# Patient Record
Sex: Female | Born: 1990 | Race: White | Hispanic: No | Marital: Single | Smoking: Never smoker
Health system: Southern US, Community
[De-identification: ages and names within clinical notes are randomized; demographics above are authoritative.]

## PROBLEM LIST (undated history)

## (undated) DIAGNOSIS — F329 Major depressive disorder, single episode, unspecified: Secondary | ICD-10-CM

## (undated) DIAGNOSIS — G4700 Insomnia, unspecified: Secondary | ICD-10-CM

## (undated) DIAGNOSIS — F32A Depression, unspecified: Secondary | ICD-10-CM

## (undated) DIAGNOSIS — F909 Attention-deficit hyperactivity disorder, unspecified type: Secondary | ICD-10-CM

## (undated) DIAGNOSIS — R011 Cardiac murmur, unspecified: Secondary | ICD-10-CM

## (undated) DIAGNOSIS — E282 Polycystic ovarian syndrome: Secondary | ICD-10-CM

## (undated) DIAGNOSIS — E669 Obesity, unspecified: Secondary | ICD-10-CM

## (undated) DIAGNOSIS — F319 Bipolar disorder, unspecified: Secondary | ICD-10-CM

## (undated) HISTORY — PX: DILATION AND CURETTAGE OF UTERUS: SHX78

## (undated) HISTORY — PX: FRACTURE SURGERY: SHX138

---

## 2011-05-10 ENCOUNTER — Emergency Department (HOSPITAL_COMMUNITY)
Admission: EM | Admit: 2011-05-10 | Discharge: 2011-05-10 | Disposition: A | Discharge status: Other Acute Inpatient Hospital | Payer: Self-pay | Source: Home / Self Care | Attending: Emergency Medicine | Admitting: Emergency Medicine

## 2011-05-10 ENCOUNTER — Inpatient Hospital Stay (HOSPITAL_COMMUNITY)
Admission: EM | Admit: 2011-05-10 | Discharge: 2011-05-11 | DRG: 918 | Disposition: A | Discharge status: Other Acute Inpatient Hospital | Payer: Self-pay | Source: Ambulatory Visit | Attending: Internal Medicine | Admitting: Internal Medicine

## 2011-05-10 DIAGNOSIS — E876 Hypokalemia: Secondary | ICD-10-CM | POA: Diagnosis present

## 2011-05-10 DIAGNOSIS — T398X2A Poisoning by other nonopioid analgesics and antipyretics, not elsewhere classified, intentional self-harm, initial encounter: Secondary | ICD-10-CM | POA: Diagnosis present

## 2011-05-10 DIAGNOSIS — D649 Anemia, unspecified: Secondary | ICD-10-CM | POA: Diagnosis present

## 2011-05-10 DIAGNOSIS — F319 Bipolar disorder, unspecified: Secondary | ICD-10-CM | POA: Diagnosis present

## 2011-05-10 DIAGNOSIS — T394X2A Poisoning by antirheumatics, not elsewhere classified, intentional self-harm, initial encounter: Secondary | ICD-10-CM | POA: Diagnosis present

## 2011-05-10 DIAGNOSIS — T39094A Poisoning by salicylates, undetermined, initial encounter: Principal | ICD-10-CM | POA: Diagnosis present

## 2011-05-10 DIAGNOSIS — R112 Nausea with vomiting, unspecified: Secondary | ICD-10-CM | POA: Diagnosis present

## 2011-05-10 LAB — BLOOD GAS, ARTERIAL
Acid-base deficit: 7.7 mmol/L — ABNORMAL HIGH (ref 0.0–2.0)
Bicarbonate: 13.6 mEq/L — ABNORMAL LOW (ref 20.0–24.0)
O2 Saturation: 98.9 %
TCO2: 12 mmol/L (ref 0–100)
pO2, Arterial: 120 mmHg — ABNORMAL HIGH (ref 80.0–100.0)

## 2011-05-10 LAB — CBC
HCT: 39.1 % (ref 36.0–46.0)
HCT: 36.5 % (ref 36.0–46.0)
Hemoglobin: 13.2 g/dL (ref 12.0–15.0)
MCH: 27.5 pg (ref 26.0–34.0)
MCHC: 33.8 g/dL (ref 30.0–36.0)
MCHC: 34.2 g/dL (ref 30.0–36.0)
MCV: 80.2 fL (ref 78.0–100.0)
Platelets: 261 10*3/uL (ref 150–400)
RBC: 4.79 MIL/uL (ref 3.87–5.11)
RDW: 13.3 % (ref 11.5–15.5)
WBC: 11.5 10*3/uL — ABNORMAL HIGH (ref 4.0–10.5)

## 2011-05-10 LAB — COMPREHENSIVE METABOLIC PANEL
ALT: 16 U/L (ref 0–35)
ALT: 16 U/L (ref 0–35)
AST: 20 U/L (ref 0–37)
AST: 18 U/L (ref 0–37)
Alkaline Phosphatase: 70 U/L (ref 39–117)
CO2: 15 mEq/L — ABNORMAL LOW (ref 19–32)
CO2: 17 mEq/L — ABNORMAL LOW (ref 19–32)
Calcium: 8.2 mg/dL — ABNORMAL LOW (ref 8.4–10.5)
Calcium: 8.6 mg/dL (ref 8.4–10.5)
Chloride: 108 mEq/L (ref 96–112)
Chloride: 106 mEq/L (ref 96–112)
GFR calc non Af Amer: >60 mL/min (ref >60)
Glucose, Bld: 121 mg/dL — ABNORMAL HIGH (ref 70–99)
Glucose, Bld: 166 mg/dL — ABNORMAL HIGH (ref 70–99)
Potassium: 3 mEq/L — ABNORMAL LOW (ref 3.5–5.1)
Sodium: 140 mEq/L (ref 135–145)
Total Bilirubin: 0.1 mg/dL — ABNORMAL LOW (ref 0.3–1.2)
Total Protein: 7.3 g/dL (ref 6.0–8.3)

## 2011-05-10 LAB — URINALYSIS, ROUTINE W REFLEX MICROSCOPIC
Glucose, UA: NEGATIVE mg/dL
Hgb urine dipstick: NEGATIVE
Specific Gravity, Urine: 1.029 (ref 1.005–1.030)
pH: 6.5 (ref 5.0–8.0)

## 2011-05-10 LAB — GLUCOSE, CAPILLARY: Glucose-Capillary: 154 mg/dL — ABNORMAL HIGH (ref 70–99)

## 2011-05-10 LAB — BASIC METABOLIC PANEL
BUN: 5 mg/dL — ABNORMAL LOW (ref 6–23)
Calcium: 8.7 mg/dL (ref 8.4–10.5)
Calcium: 8.2 mg/dL — ABNORMAL LOW (ref 8.4–10.5)
Creatinine, Ser: 0.66 mg/dL (ref 0.4–1.2)
GFR calc non Af Amer: >60 mL/min (ref >60)
GFR calc non Af Amer: >60 mL/min (ref >60)
Glucose, Bld: 120 mg/dL — ABNORMAL HIGH (ref 70–99)
Glucose, Bld: 86 mg/dL (ref 70–99)
Sodium: 139 mEq/L (ref 135–145)
Sodium: 139 mEq/L (ref 135–145)

## 2011-05-10 LAB — RAPID URINE DRUG SCREEN, HOSP PERFORMED
Barbiturates: NOT DETECTED
Opiates: NOT DETECTED
Tetrahydrocannabinol: NOT DETECTED

## 2011-05-10 LAB — DIFFERENTIAL
Eosinophils Relative: 0 % (ref 0–5)
Lymphocytes Relative: 10 % — ABNORMAL LOW (ref 12–46)
Lymphs Abs: 1 10*3/uL (ref 0.7–4.0)
Monocytes Relative: 5 % (ref 3–12)
Neutro Abs: 8 10*3/uL — ABNORMAL HIGH (ref 1.7–7.7)

## 2011-05-10 LAB — URINE MICROSCOPIC-ADD ON

## 2011-05-10 LAB — MAGNESIUM: Magnesium: 2 mg/dL (ref 1.5–2.5)

## 2011-05-10 LAB — ETHANOL: Alcohol, Ethyl (B): <11 mg/dL — ABNORMAL HIGH (ref 0–10)

## 2011-05-10 LAB — MRSA PCR SCREENING: MRSA by PCR: NEGATIVE

## 2011-05-10 LAB — PREGNANCY, URINE: Preg Test, Ur: NEGATIVE

## 2011-05-11 ENCOUNTER — Inpatient Hospital Stay (HOSPITAL_COMMUNITY)
Admission: AD | Admit: 2011-05-11 | Discharge: 2011-05-18 | DRG: 882 | Disposition: A | Discharge status: Home / Self Care | Payer: PRIVATE HEALTH INSURANCE | Source: Ambulatory Visit | Attending: Psychiatry | Admitting: Psychiatry

## 2011-05-11 DIAGNOSIS — F4310 Post-traumatic stress disorder, unspecified: Secondary | ICD-10-CM

## 2011-05-11 DIAGNOSIS — T394X2A Poisoning by antirheumatics, not elsewhere classified, intentional self-harm, initial encounter: Secondary | ICD-10-CM

## 2011-05-11 DIAGNOSIS — F3112 Bipolar disorder, current episode manic without psychotic features, moderate: Secondary | ICD-10-CM

## 2011-05-11 DIAGNOSIS — Z5900 Homelessness unspecified: Secondary | ICD-10-CM

## 2011-05-11 DIAGNOSIS — F4325 Adjustment disorder with mixed disturbance of emotions and conduct: Principal | ICD-10-CM

## 2011-05-11 DIAGNOSIS — E282 Polycystic ovarian syndrome: Secondary | ICD-10-CM

## 2011-05-11 DIAGNOSIS — F339 Major depressive disorder, recurrent, unspecified: Secondary | ICD-10-CM

## 2011-05-11 DIAGNOSIS — R51 Headache: Secondary | ICD-10-CM

## 2011-05-11 DIAGNOSIS — T391X1A Poisoning by 4-Aminophenol derivatives, accidental (unintentional), initial encounter: Secondary | ICD-10-CM

## 2011-05-11 DIAGNOSIS — F39 Unspecified mood [affective] disorder: Secondary | ICD-10-CM

## 2011-05-11 LAB — COMPREHENSIVE METABOLIC PANEL
ALT: 13 U/L (ref 0–35)
AST: 13 U/L (ref 0–37)
CO2: 21 mEq/L (ref 19–32)
Chloride: 111 mEq/L (ref 96–112)
Creatinine, Ser: 0.74 mg/dL (ref 0.4–1.2)
GFR calc Af Amer: >60 mL/min (ref >60)
GFR calc non Af Amer: >60 mL/min (ref >60)
Glucose, Bld: 107 mg/dL — ABNORMAL HIGH (ref 70–99)
Total Bilirubin: 0.2 mg/dL — ABNORMAL LOW (ref 0.3–1.2)

## 2011-05-11 LAB — CBC
Hemoglobin: 10.9 g/dL — ABNORMAL LOW (ref 12.0–15.0)
MCH: 28.4 pg (ref 26.0–34.0)
MCHC: 34.4 g/dL (ref 30.0–36.0)
RDW: 13.9 % (ref 11.5–15.5)

## 2011-05-11 LAB — MAGNESIUM: Magnesium: 2.1 mg/dL (ref 1.5–2.5)

## 2011-05-11 LAB — PROTIME-INR
INR: 1.29 (ref 0.00–1.49)
Prothrombin Time: 16.3 seconds — ABNORMAL HIGH (ref 11.6–15.2)
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
## 2011-05-11 NOTE — Discharge Summary (Signed)
[name], Hannah [name]                 ACCOUNT NO.:  [id number]  MEDICAL RECORD NO.:  [id number]           PATIENT TYPE:  I  LOCATION:  3302                         FACILITY:  [facility]  PHYSICIAN:  Hillery Aldo, M.D.   DATE OF BIRTH:  04-Aug-1991  DATE OF ADMISSION:  05/10/2011 DATE OF DISCHARGE:  05/11/2011                              DISCHARGE SUMMARY   PRIMARY CARE PHYSICIAN:  None.  DISCHARGE DIAGNOSES: 1. Aspirin/Tylenol overdose. 2. Suicide attempt. 3. Hypokalemia. 4. Normocytic anemia. 5. Questionable history of bipolar disorder.  DISCHARGE MEDICATIONS:  None.  CONSULTATIONS:  Eulogio Ditch, MD, Psychiatry.  BRIEF ADMISSION HISTORY OF PRESENT ILLNESS:  The patient is a 20 year old female with a questionable history of bipolar disorder who presented to the hospital with chief complaint of nausea and vomiting after taking approximately 90 Excedrin Extra Strength tablets in an attempt to end her life over a breakup with her boyfriend.  Upon initial evaluation in the emergency department, the patient was complaining of tinnitus but was otherwise stable.  For full details, please see the dictated report done by Dr. Adela Glimpse.  PROCEDURES AND DIAGNOSTIC STUDIES:  None.  DISCHARGE LABORATORY VALUES:  Salicylate level was 4.7.  Magnesium was 2.1.  Sodium was 140, potassium 3.8, chloride 111, bicarb 21, BUN 12, creatinine 0.74, glucose 107, total bilirubin 0.2, alkaline phosphatase 60, AST 13, ALT 13, total protein 5.8, albumin 2.9, calcium 8.5. Acetaminophen level was less than 15.  PTT was 32, PT 16.3.  White blood cell count was 6.5, hemoglobin 10.9, hematocrit 31.7, platelets 241,000. Arterial blood gas showed a pH of 7.434, pCO2 31, pO2 106.  HOSPITAL COURSE BY PROBLEM: 1. Aspirin/acetaminophen overdose:  The patient was monitored closely     in the Step-down Unit.  Her urine was alkalinized and serial     laboratories were checked.  The patient did not develop any  signs     or symptoms of liver or other toxicities.  At this point, she can     safely discontinue IV fluids and be transferred to the Psychiatric     Unit for further care of her ongoing psychiatric issues. 2. Suicide attempt:  The patient was maintained with a Recruitment consultant     and has now been evaluated by Psychiatry and accepted to the     Inpatient Psychiatric Unit for further care. 3. Hypokalemia:  The patient's potassium was fully repleted. 4. Normocytic anemia:  Felt to be secondary to menstrual losses in     this 20 year old female.  No further workup was undertaken. 5. Questionable history of bipolar disorder:  The patient will need to     be stabilized on psychiatric medications and this can be     accomplished at the Inpatient Psychiatric Facility.  DISPOSITION:  The patient is medically stable and will be discharged to Inpatient Psychiatry for further care of her underlying psychiatric issues.  CONDITION ON DISCHARGE:  Stable.  Time spent coordinating care for discharge and discharge instructions including face-to-face time is approximately 20 minutes.     Hillery Aldo, M.D.     CR/MEDQ  D:  05/11/2011  T:  05/11/2011  Job:  [id number]  Electronically Signed by Hillery Aldo M.D. on 05/11/2011 01:14:50 PM

## 2011-05-12 LAB — BLOOD GAS, ARTERIAL
Acid-base deficit: 3.2 mmol/L — ABNORMAL HIGH (ref 0.0–2.0)
Drawn by: 31306
O2 Saturation: 98.8 %
Patient temperature: 98.6
pCO2 arterial: 31 mmHg — ABNORMAL LOW (ref 35.0–45.0)

## 2011-05-12 NOTE — Consult Note (Signed)
  NAME:  Hannah [name], Hannah [name]                 ACCOUNT NO.:  [id number]  MEDICAL RECORD NO.:  [id number]           PATIENT TYPE:  I  LOCATION:  500                           [facility]ILITY[facility]  [facility]  PHYSICIAN:  Eulogio Ditch, MD DATE OF BIRTH:  Feb 08, 1991  DATE OF CONSULTATION: DATE OF DISCHARGE:                                CONSULTATION   REASON FOR CONSULTATION:  Overdose.  HISTORY OF PRESENT ILLNESS:  A 20 year old female who overdosed on 90 pills of Excedrin extra strength as she was feeling depressed after separation from boyfriend.  The patient reported she has a history of bipolar disorder and she remained most of the time in the depressive phase.  The patient was on Depakote and Abilify in the past for few years, but currently she is noncompliant with her medication as she told she cannot afford these medications.  MEDICAL HISTORY:  Significant for ovarian cyst.  SUBSTANCE ABUSE HISTORY:  The patient denies abusing drugs or alcohol.  SOCIAL HISTORY:  The patient currently is homeless.  She has been all the time in the foster care.  PAST PSYCH HISTORY:  The patient has been admitted in [place], the last time 5 to 6 years ago.  The patient has a history of bipolar disorder.  ALLERGIES:  The patient is allergic to DEMEROL.  MENTAL STATUS EXAM:  The patient is calm, cooperative during interview. Fair eye contact.  Mood, depressed affect, mood congruent.  Speech soft, slow.  No abnormal movements noticed.  Thought process logical and goal directed.  The patient is still depressed and has suicidal ideations on and off.  The patient wants to be back on her medication and wants to be admitted at behavioral health.  The patient is not internally preoccupied, not delusional.  Denies hearing any voices.  Denies any visual hallucinations.  Cognition alert, awake, oriented x3.  Memory immediate, recent remote fair.  Attention and concentration fair. Abstraction ability fair.  Insight and  judgment fair to poor.  DIAGNOSES:  AXIS I:  As per history of bipolar disorder type 1, depressed phase. AXIS II:  Deferred. AXIS III:  See medical notes. AXIS IV:  Poor psychosocial support system. AXIS V:  30.  RECOMMENDATIONS: 1. The patient will be admitted to behavioral health after she is     medically stable. 2. I will not start any medication at this time because of the     overdose.  Once the patient is medically stable, the patient will     be started on her bipolar medications.  Thanks for involving me in taking care of this patient.     Eulogio Ditch, MD     SA/MEDQ  D:  05/11/2011  T:  05/11/2011  Job:  [id number]  Electronically Signed by Eulogio Ditch  on 05/12/2011 05:32:06 PM

## 2011-05-14 DIAGNOSIS — F603 Borderline personality disorder: Secondary | ICD-10-CM

## 2011-05-14 DIAGNOSIS — F339 Major depressive disorder, recurrent, unspecified: Secondary | ICD-10-CM

## 2011-05-18 LAB — T3 UPTAKE: T3 Uptake Ratio: 33.9 % (ref 22.5–37.0)

## 2011-05-19 NOTE — Discharge Summary (Signed)
Hannah [name], Hannah [name] NO.:  [id number]  MEDICAL RECORD NO.:  [id number]  LOCATION:  0502                          FACILITY:  [facility]  PHYSICIAN:  Franchot Gallo, MD     DATE OF BIRTH:  [birth year]  DATE OF ADMISSION:  05/11/2011 DATE OF DISCHARGE:  05/18/2011                              DISCHARGE SUMMARY   REASON FOR ADMISSION:  This is a 20 year old female who was admitted after overdosing on approximately 13 Excedrin tablets wanting to hurt herself because of boyfriend had broken up with her.  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 AXIS II: Deferred. AXIS III: History of heart murmur, polycystic ovarian disease, history of normocytic anemia and status post overdose Excedrin. AXIS IV: Severe, problems with primary support group, lack of support education, occupation, housing, Nurse, children's and psychosocial issues. AXIS V: 50-55.  PERTINENT LABS:  Acetaminophen level was elevated at 116.  Urine drug screen is negative.  Salicylate level 48.  Alcohol level of 11.  CBC essentially within normal limits.  Alcohol level less than 11.  Urine pregnancy test is negative.  SIGNIFICANT FINDINGS:  This is alert and oriented female, somewhat unkempt admitted to the adult milieu in the mood disorder group.  Her mood was depressed.  Her affect was flat.  We had Vistaril available for sleep.  The patient was started on Abilify.  She reported having a long history of depression.  She stated that her living arrangements was with a church family.  She was improving.  Her sleep was better.  Her appetite however was still minimal, rating her depression to moderate to severe, but denied any suicidal or homicidal thoughts or any psychotic symptoms.  She had was having some headaches and feeling very angry.  We started Zoloft for her depression.  She was participating in groups and felt that her medication  was helping her feel more level headed.  We contacted the patient's friends from church, Jonny Ruiz and Cheri Guppy to gather information and address any safety issues.  She was up and active in the milieu and was beginning to feel more stable on her Abilify and she did continue with depressive symptoms but denied any suicidal thoughts, was reporting that she was homeless.  There was some uncertainty about her returning to her church family.  We had trazodone for sleep and continue to assess her living situation.  She was beginning to improve.  Her sleep was good.  Appetite was good, having mild depressive symptoms rating it at 2 on a scale of 1-10.  She felt the Zoloft was helpful but the medication was discontinued over the weekend.  She did have a nightmare the night before about her boyfriend, but this denied any other complaints.  We initiated Celexa for her depressive symptoms.  She did have some complaint about bruising to her hand and forearm with no signs of any injury.  On day of discharge the patient's sleep was good,  appetite was good.  Her depression was under good control rating it a zero  on a scale of 1-10, adamantly denied any suicidal thoughts  or auditory  hallucinations or homicidal ideation.  No medication side effects were  reported.  Her church family had the patient sign a contract for conditions  about her returning to live with them.  The patient was stable for discharge.  DISCHARGE MEDICATIONS:  Abilify 2 mg q.h.s.  FOLLOW-UP APPOINTMENT:  With [facility] at phone number [phone] on Wednesday 06/06.     Landry Corporal, N.P.   ______________________________ Franchot Gallo, MD    JO/MEDQ  D:  05/19/2011  T:  05/19/2011  Job:  [id number]  Electronically Signed by Limmie PatriciaP. on 05/19/2011 05:13:56 PM Electronically Signed by Franchot Gallo MD on 05/19/2011 05:21:22 PM

## 2011-06-01 NOTE — H&P (Signed)
Hannah [name]                 ACCOUNT NO.:  [id number]  MEDICAL RECORD NO.:  [id number]           PATIENT TYPE:  I  LOCATION:  0501                          [facility]ILITY[facility]  [facility]  PHYSICIAN:  Eulogio Ditch, MD DATE OF BIRTH:  05-Aug-1991  DATE OF ADMISSION:  05/11/2011 DATE OF DISCHARGE:                      PSYCHIATRIC ADMISSION ASSESSMENT   HPI:  This is a 20 year old single white female.  She apparently called EMS her self after over-dosing on approximately 90 Excedrin at about 12:30 in the morning.  She wanted to hurt herself because her boyfriend had broken up with her.  She arrived vomiting.  Therefore, Poison Control advised not to give any charcoal to her.  She was now stating: "Let me live.  I want to live.  I was depressed, but now I want to live."  Apparently, she moved here from [place] about a year ago.  She has had this boyfriend for about 8 months.  Apparently, he told her he wanted her out of the house; that he was breaking up with her.  He left. He wanted her out by yesterday, so she over-dosed.  Apparently, she was in foster care most of her life.  She reports that she thinks she had an inpatient hospitalization in 2006 or 2007 between the ages of 71 and 51 while in foster care.  She did receive therapy as well as med management.  She reports memories of Depakote and Abilify.  She has not had either medication in a while.  She states that recently she has had extreme reactions.  If something does not go her way, she gets unusually depressed, etc.  SOCIAL HISTORY:  She has a GED.  She has never married.  She does have one daughter, age three.  The daughter was born to her while she was in foster care and was taken away from her.  FAMILY HISTORY:  She denies any knowledge of her parents.  She states she met her mother once, and her mother reported that she had developed PTSD by the patient coming back into her life.  ALCOHOL AND DRUG HISTORY:  She was not  positive for any drugs or alcohol, and there was no history noted.  MEDICAL CARE PROVIDER:  She has no PCP.  She states she has no money; that she cannot afford to go to the doctor.  MEDICAL PROBLEMS:  She states that she has been diagnosed with a heart murmur as well as polycystic ovarian disease.  MEDICATIONS:  None.  DRUG ALLERGIES:  Welts.  POSITIVE PHYSICAL FINDINGS:  A well-developed, well-nourished white female who appears her stated age.  She is still in some distress, although it is mostly psychological.  She was afebrile.  Her temperature was 97.9 to 98.2.  Her pulse was 93 to 100.  Respirations were 20 to 30. Her blood pressure ranged from 108/55 to 127/62.  She was negative for pregnancy.  Her urinalysis showed a small amount of leukocyte esterase.  However, she was nitrite negative.  She did have many bacteria in her urine.  She declined testing for STDs.  She had no measurable alcohol and no  abnormalities of note on her CBC.  Her original acetaminophen level was 116.  She was treated in the Emergency Department by Dr. Hillery Aldo, and she did not develop any signs or symptoms of liver or other toxicities. She was closely monitored in the step-down unit.  Her urine was alkalinized, and serial labs were checked.  There was safe to discontinue IV fluids, and hence she was transferred to the [facility].  MENTAL STATUS EXAM:  Tonight, she is alert and oriented.  She is somewhat unkempt, but she is appropriately dressed in her own clothing. She appears to be adequately nourished.  Her speech is slow.  Her mood is depressed.  Her affect is somewhat flat.  Thought processes are somewhat clear, rational, goal-oriented.  She realizes she has no place to go.  Judgment and insight are poor  Concentration and memory are superficially intact.  Intelligence is average.  She is no longer actively suicidal.  She is not homicidal.  She is not hearing any auditory  or visual hallucinations.  While in the Emergency Department, she was seen by a Child psychotherapist.  She had the social worker call the ex-boyfriend to let him know that she had been hospitalized, and it indicates that she realizes that she may in fact be homeless.  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 AXIS II:  Deferred, although she was in foster care most of her life. AXIS III:  She gives a history of a heart murmur and polycystic ovarian disease.  She has normocytic anemia, and she did not have any untoward effects after her over-dose with the Excedrin. AXIS IV:  Severe.  She has problems with a primary support group, education, occupation, housing, economics, other psychosocial problems. She does not have any legal issues that we are aware of or that she is reporting. AXIS V:  40.  The plan is to admit for safety and stabilization.  She was not started on any medications per Dr. Rogers Blocker.  Tonight, we will give her Vistaril 50 mg at h.s. p.r.n. for sleep and tomorrow will see what Case Management  wants to do.  Estimated length of stay is 2 to 3 days.     Hannah [name], P.A.-C.   ______________________________ Eulogio Ditch, MD    MD/MEDQ  D:  05/11/2011  T:  05/11/2011  Job:  [id number]  Electronically Signed by [name] P.A.-C. on 05/23/2011 09:47:50 AM Electronically Signed by Eulogio Ditch  on 06/01/2011 09:54:57 AM

## 2013-06-07 ENCOUNTER — Encounter (HOSPITAL_COMMUNITY): Payer: Self-pay | Admitting: *Deleted

## 2013-06-07 ENCOUNTER — Emergency Department (HOSPITAL_COMMUNITY)
Admission: EM | Admit: 2013-06-07 | Discharge: 2013-06-09 | Disposition: A | Discharge status: Other Acute Inpatient Hospital | Payer: Self-pay | Attending: Emergency Medicine | Admitting: Emergency Medicine

## 2013-06-07 DIAGNOSIS — Z79899 Other long term (current) drug therapy: Secondary | ICD-10-CM | POA: Insufficient documentation

## 2013-06-07 DIAGNOSIS — Z3202 Encounter for pregnancy test, result negative: Secondary | ICD-10-CM | POA: Insufficient documentation

## 2013-06-07 DIAGNOSIS — R110 Nausea: Secondary | ICD-10-CM | POA: Insufficient documentation

## 2013-06-07 DIAGNOSIS — R011 Cardiac murmur, unspecified: Secondary | ICD-10-CM | POA: Insufficient documentation

## 2013-06-07 DIAGNOSIS — Z8659 Personal history of other mental and behavioral disorders: Secondary | ICD-10-CM | POA: Insufficient documentation

## 2013-06-07 DIAGNOSIS — F319 Bipolar disorder, unspecified: Secondary | ICD-10-CM | POA: Insufficient documentation

## 2013-06-07 DIAGNOSIS — J3489 Other specified disorders of nose and nasal sinuses: Secondary | ICD-10-CM | POA: Insufficient documentation

## 2013-06-07 DIAGNOSIS — Z91040 Latex allergy status: Secondary | ICD-10-CM | POA: Insufficient documentation

## 2013-06-07 DIAGNOSIS — E669 Obesity, unspecified: Secondary | ICD-10-CM | POA: Insufficient documentation

## 2013-06-07 HISTORY — DX: Insomnia, unspecified: G47.00

## 2013-06-07 HISTORY — DX: Attention-deficit hyperactivity disorder, unspecified type: F90.9

## 2013-06-07 HISTORY — DX: Bipolar disorder, unspecified: F31.9

## 2013-06-07 HISTORY — DX: Obesity, unspecified: E66.9

## 2013-06-07 HISTORY — DX: Major depressive disorder, single episode, unspecified: F32.9

## 2013-06-07 HISTORY — DX: Depression, unspecified: F32.A

## 2013-06-07 HISTORY — DX: Cardiac murmur, unspecified: R01.1

## 2013-06-07 LAB — CBC WITH DIFFERENTIAL/PLATELET
Eosinophils Absolute: 0.1 10*3/uL (ref 0.0–0.7)
Eosinophils Relative: 2 % (ref 0–5)
Hemoglobin: 12.8 g/dL (ref 12.0–15.0)
Lymphs Abs: 1.2 10*3/uL (ref 0.7–4.0)
MCH: 26.8 pg (ref 26.0–34.0)
MCHC: 32.9 g/dL (ref 30.0–36.0)
MCV: 81.4 fL (ref 78.0–100.0)
Monocytes Relative: 9 % (ref 3–12)
RBC: 4.78 MIL/uL (ref 3.87–5.11)

## 2013-06-07 LAB — COMPREHENSIVE METABOLIC PANEL
Alkaline Phosphatase: 93 U/L (ref 39–117)
BUN: 10 mg/dL (ref 6–23)
Calcium: 9.4 mg/dL (ref 8.4–10.5)
Creatinine, Ser: 0.91 mg/dL (ref 0.50–1.10)
GFR calc Af Amer: >90 mL/min (ref >90)
Glucose, Bld: 73 mg/dL (ref 70–99)
Total Protein: 7.1 g/dL (ref 6.0–8.3)

## 2013-06-07 LAB — URINALYSIS, ROUTINE W REFLEX MICROSCOPIC
Ketones, ur: 15 mg/dL — AB
Leukocytes, UA: NEGATIVE
Nitrite: NEGATIVE
Specific Gravity, Urine: 1.033 — ABNORMAL HIGH (ref 1.005–1.030)
pH: 6.5 (ref 5.0–8.0)

## 2013-06-07 LAB — RAPID URINE DRUG SCREEN, HOSP PERFORMED: Amphetamines: NOT DETECTED

## 2013-06-07 LAB — URINE MICROSCOPIC-ADD ON

## 2013-06-07 LAB — ACETAMINOPHEN LEVEL: Acetaminophen (Tylenol), Serum: <15 ug/mL (ref 10–30)

## 2013-06-07 MED ORDER — ALUM & MAG HYDROXIDE-SIMETH 200-200-20 MG/5ML PO SUSP: 30.0000 mL | ORAL | Status: DC | PRN

## 2013-06-07 MED ORDER — ONDANSETRON HCL 4 MG PO TABS: 4.0000 mg | ORAL_TABLET | Freq: Three times a day (TID) | ORAL | Status: DC | PRN

## 2013-06-07 MED ORDER — IBUPROFEN 400 MG PO TABS
600.0000 mg | ORAL_TABLET | Freq: Three times a day (TID) | ORAL | Status: DC | PRN
  Administered 2013-06-07 – 2013-06-08 (×2): 600 mg via ORAL
  Filled 2013-06-07 (×2): qty 1

## 2013-06-07 MED ORDER — ACETAMINOPHEN 325 MG PO TABS: 650.0000 mg | ORAL_TABLET | Freq: Once | ORAL | Status: DC

## 2013-06-07 MED ORDER — LORAZEPAM 1 MG PO TABS
1.0000 mg | ORAL_TABLET | Freq: Three times a day (TID) | ORAL | Status: DC | PRN
  Administered 2013-06-07 – 2013-06-08 (×2): 1 mg via ORAL
  Filled 2013-06-07 (×2): qty 1

## 2013-06-07 MED ORDER — ZOLPIDEM TARTRATE 5 MG PO TABS: 5.0000 mg | ORAL_TABLET | Freq: Every evening | ORAL | Status: DC | PRN

## 2013-06-07 MED ORDER — OXYMETAZOLINE HCL 0.05 % NA SOLN
1.0000 | Freq: Two times a day (BID) | NASAL | Status: DC | PRN
  Administered 2013-06-07 – 2013-06-08 (×3): 1 via NASAL
  Filled 2013-06-07 (×2): qty 15

## 2013-06-07 MED ORDER — ACETAMINOPHEN 325 MG PO TABS: 650.0000 mg | ORAL_TABLET | ORAL | Status: DC | PRN

## 2013-06-07 NOTE — ED Notes (Signed)
To the nurses station talking to family on the phone.

## 2013-06-07 NOTE — ED Notes (Signed)
PATIENT HAS COMPLETED TELEPSYCH AND HAS NOW DECIDED SHE IS SI. PT HAD DISCUSSED A PLAN TO GO HOME AND FU OUTPATIENT WITH TELEPSYCH DOCTOR. SOCIAL WORKER HAD BEEN CALLED ETC. ACT TEAM MADE AWARE AND WILL ADDRESS HER

## 2013-06-07 NOTE — BH Assessment (Addendum)
[facility] Assessment Progress Note      Update: Consulted with PA Piepenbrink who agreed pt receive telepsych before ACT sees pt for further evaluation and recommendations, as pt denies current SI/HI or psychosis, but is asking for a medication adjustment.  Dr. Leretha Pol, psychiatrist with [facility], completed telepsych and recommendation for social work involvement made as well as care management for housing and medication needs.  Pt needs housing and cannot afford medications.  Outpatient referrals given to the pt by this clinician.  Updated ED staff and Pollyann Savoy, LCSW, ED SW.  No action needed by ACT at this time.

## 2013-06-07 NOTE — ED Notes (Signed)
Pt reports being bipolar and has been off her meds for a year and having mood swings, wants to be put back on meds, has been unable to afford her prescriptions. Denies any SI or HI.

## 2013-06-07 NOTE — ED Notes (Signed)
MEAL ORDERED FOR PT 

## 2013-06-07 NOTE — ED Notes (Signed)
telepsych called and faxed. Pt has 6 before hers

## 2013-06-07 NOTE — ED Provider Notes (Signed)
History    CSN: [id number] Arrival date & time 06/07/13  1159  First MD Initiated Contact with Patient 06/07/13 1227     Chief Complaint  Patient presents with  . Medical Clearance   (Consider location/radiation/quality/duration/timing/severity/associated sxs/prior Treatment) HPI  Patient is a 22 yo F PMHx significant for Bipolar 1 disorder, ADHD, insomnia, depression, history of suicide attempts and self harm presenting to the ED requesting voluntary commitment because she does not wish to be placed back on her bipolar medications stating that she will "black out and do something bad" if she goes back on these medications. Patient states she has not been on any medications for one year. Denies suicidal or homicidal ideations, no self harm, AV hallucinations, ETOH or RD use. No physical complaints.   Past Medical History  Diagnosis Date  . Bipolar 1 disorder   . ADHD (attention deficit hyperactivity disorder)   . Heart murmur   . Insomnia   . Depression   . Obesity    History reviewed. No pertinent past surgical history. History reviewed. No pertinent family history. History  Substance Use Topics  . Smoking status: Not on file  . Smokeless tobacco: Not on file  . Alcohol Use: Yes     Comment: occ   OB History   Grav Para Term Preterm Abortions TAB SAB Ect Mult Living                 Review of Systems  HENT: Positive for congestion and rhinorrhea.   Gastrointestinal: Positive for nausea.  Psychiatric/Behavioral: Negative for suicidal ideas, sleep disturbance and self-injury.  All other systems reviewed and are negative.    Allergies  Demerol; Mushroom extract complex; and Latex  Home Medications   Current Outpatient Rx  Name  Route  Sig  Dispense  Refill  . guaiFENesin (MUCINEX) 600 MG 12 hr tablet   Oral   Take 600 mg by mouth 2 (two) times daily.         . pseudoephedrine (SUDAFED) 120 MG 12 hr tablet   Oral   Take 120 mg by mouth every 12 (twelve)  hours.          BP 118/65  Pulse 90  Temp(Src) 98.9 F (37.2 C) (Oral)  Resp 24  SpO2 99%  LMP 06/07/2013 Physical Exam  Constitutional: She is oriented to person, place, and time. She appears well-developed and well-nourished.  HENT:  Head: Normocephalic and atraumatic.  Mouth/Throat: Oropharynx is clear and moist.  Eyes: Conjunctivae are normal. Pupils are equal, round, and reactive to light.  Neck: Neck supple.  Cardiovascular: Normal rate, regular rhythm, normal heart sounds and intact distal pulses.   Pulmonary/Chest: Effort normal and breath sounds normal. No respiratory distress.  Abdominal: Soft. Bowel sounds are normal. There is no tenderness.  Musculoskeletal: She exhibits no edema.  Lymphadenopathy:    She has no cervical adenopathy.  Neurological: She is alert and oriented to person, place, and time.  Skin: Skin is warm and dry.  Psychiatric: She has a normal mood and affect.    ED Course  Procedures (including critical care time) Labs Reviewed  COMPREHENSIVE METABOLIC PANEL - Abnormal; Notable for the following:    Albumin 3.4 (*)    Total Bilirubin 0.2 (*)    GFR calc non Af Amer 90 (*)    All other components within normal limits  URINALYSIS, ROUTINE W REFLEX MICROSCOPIC - Abnormal; Notable for the following:    Specific Gravity, Urine 1.033 (*)  Hgb urine dipstick TRACE (*)    Ketones, ur 15 (*)    All other components within normal limits  SALICYLATE LEVEL - Abnormal; Notable for the following:    Salicylate Lvl <2.0 (*)    All other components within normal limits  URINE MICROSCOPIC-ADD ON - Abnormal; Notable for the following:    Squamous Epithelial / LPF FEW (*)    All other components within normal limits  CBC WITH DIFFERENTIAL  ETHANOL  ACETAMINOPHEN LEVEL  URINE RAPID DRUG SCREEN (HOSP PERFORMED)   No results found. 1. Bipolar 1 disorder     MDM  Pt presents to the ED for medical clearance.  Pt is not currently having SI or HI  ideations. Pt does not have a plan. The patients demeanor is appropriate. The patient currently does not have any acute physical complaints and is in no acute distress. The patient was brought to ED by self seeking voluntary commitment. ACT consult was appreciated and pt was moved to Psych ED for further evaluation. Telepsych consulted. Stable at time of shift change. Patient d/w with Dr. Rosalia Hammers, agrees with plan.        Jeannetta Ellis, PA-C 06/07/13 2048
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
## 2013-06-08 LAB — POCT PREGNANCY, URINE: Preg Test, Ur: NEGATIVE

## 2013-06-08 MED ORDER — PSEUDOEPHEDRINE HCL 30 MG PO TABS
30.0000 mg | ORAL_TABLET | Freq: Three times a day (TID) | ORAL | Status: DC | PRN
  Administered 2013-06-09: 30 mg via ORAL
  Filled 2013-06-08 (×2): qty 1

## 2013-06-08 MED ORDER — PSEUDOEPHEDRINE HCL 30 MG PO TABS
30.0000 mg | ORAL_TABLET | ORAL | Status: AC
  Administered 2013-06-08: 30 mg via ORAL
  Filled 2013-06-08: qty 1

## 2013-06-08 NOTE — ED Provider Notes (Signed)
BP 112/70  Pulse 64  Temp(Src) 97.8 F (36.6 C) (Oral)  Resp 20  SpO2 97%  LMP 06/07/2013 Pt stable and awaiting placement   Joya Gaskins, MD 06/08/13 [phone]

## 2013-06-08 NOTE — ED Notes (Signed)
Pt notified that urine sample is needed. Pt states she will let nurse know when she has to use the bathroom and will give a sample then.

## 2013-06-08 NOTE — ED Notes (Signed)
Called AC to see if patient is on sitter list for 0700.  She informed that patient was never placed on the list.   Patient will be moved to a camera room and she is on the list to get a sitter.

## 2013-06-08 NOTE — ED Notes (Signed)
ACT TEAM IN TO SEE PT. SHE HAS BEEN ON THE PHONE TALKING

## 2013-06-08 NOTE — BHH Counselor (Signed)
Pt is constantly on the phone, behavior is not congruent with SI. Denice Bors, AADC 06/08/2013 12:25 PM

## 2013-06-08 NOTE — ED Notes (Signed)
Pt requested female sitter. House coverage notified and sitter switched with C20. Pt given medication per request. Pt asked if she had any phone calls. Pt notified that no phone calls have been received at this time. NAD noted. Pt in room with sitter at bedside.

## 2013-06-08 NOTE — ED Notes (Signed)
PATIENT IS ON PHONE

## 2013-06-08 NOTE — BH Assessment (Signed)
Assessment Note   Patient is a 22 year old white female with SI.  Patient reports that she is not able to contract for safety.  Patient reports that she does not have a plan. Patient received a Tele Psych that recommends ok to discharge with case management and referrals from social work .    Patient reports a prior diagnosis of Bipolar Disorder and ADHD.  Patient reports that she has not been on her medication for some time knows. Patient reports that she is unsure when she took her medication last. Patient reports insomnia, depression, history of suicide attempts and self-harm.    Patient reports the last time that she cut herself was when she was 22 years old.  Patient reports an extensive history of psychiatric hospitalizations in [place]. Patient reports that she needs to be placed back on some type of medication that will not cause her to "black out and do something bad" if she goes back on those old medications for her Bipolar Disorder.   Patient denies HI.  Patient denies psychosis.  Patient BAL was <11.  Patients UDS was negative.  Patient denies substance abuse.  Patient reports that she is now experiencing anger issues and mood swings she has not been taking her medication.  Patient reports recent stress associated with the death in her family as well as the custody hearing for her to try to obtain custody of her son from his father.     Axis I: Bipolar, mixed Axis II: Deferred Axis III:  Past Medical History  Diagnosis Date  . Bipolar 1 disorder   . ADHD (attention deficit hyperactivity disorder)   . Heart murmur   . Insomnia   . Depression   . Obesity    Axis IV: economic problems, housing problems, occupational problems, other psychosocial or environmental problems, problems related to social environment and problems with primary support group Axis V: 31-40 impairment in reality testing  Past Medical History:  Past Medical History  Diagnosis Date  . Bipolar 1 disorder   .  ADHD (attention deficit hyperactivity disorder)   . Heart murmur   . Insomnia   . Depression   . Obesity     History reviewed. No pertinent past surgical history.  Family History: History reviewed. No pertinent family history.  Social History:  reports that  drinks alcohol. She reports that she does not use illicit drugs. Her tobacco history is not on file.  Additional Social History:     CIWA: CIWA-Ar BP: 131/92 mmHg Pulse Rate: 73 COWS:    Allergies:  Allergies  Allergen Reactions  . Demerol (Meperidine) Hives  . Mushroom Extract Complex   . Latex Rash    Home Medications:  (Not in a hospital admission)  OB/GYN Status:  Patient's last menstrual period was 06/07/2013.  General Assessment Data Location of Assessment: [facility] ED ACT Assessment: Yes Living Arrangements: Other (Comment) Can pt return to current living arrangement?: Yes Admission Status: Voluntary Is patient capable of signing voluntary admission?: Yes Transfer from: Acute Hospital Referral Source: Self/Family/Friend  Education Status Is patient currently in school?: No  Risk to self Suicidal Ideation: Yes-Currently Present Suicidal Intent: No-Not Currently/Within Last 6 Months Is patient at risk for suicide?: Yes Suicidal Plan?: No Access to Means: No What has been your use of drugs/alcohol within the last 12 months?: None Previous Attempts/Gestures: No How many times?: 0 Other Self Harm Risks: No Triggers for Past Attempts: Unpredictable Intentional Self Injurious Behavior: Cutting Comment - Self Injurious  Behavior: Cutting in the past.  Patient reports that she has not    cut since she was 22 years old.   Family Suicide History: No Recent stressful life event(s): Financial Problems;Other (Comment) Persecutory voices/beliefs?: No Depression: Yes Depression Symptoms: Despondent;Insomnia;Fatigue;Guilt;Loss of interest in usual pleasures;Feeling worthless/self pity Substance abuse history and/or  treatment for substance abuse?: No Suicide prevention information given to non-admitted patients: Yes  Risk to Others Homicidal Ideation: No Thoughts of Harm to Others: No Current Homicidal Intent: No Current Homicidal Plan: No Access to Homicidal Means: No Identified Victim: None  History of harm to others?: No Assessment of Violence: None Noted Violent Behavior Description: calm Does patient have access to weapons?: No Criminal Charges Pending?: No Does patient have a court date: No  Psychosis Hallucinations: None noted Delusions: None noted  Mental Status Report Appear/Hygiene: Disheveled Eye Contact: Poor Motor Activity: Freedom of movement Speech: Logical/coherent Level of Consciousness: Alert Mood: Depressed Affect: Appropriate to circumstance Anxiety Level: None Thought Processes: Coherent;Relevant Judgement: Unimpaired Orientation: Person;Place;Time;Situation Obsessive Compulsive Thoughts/Behaviors: None  Cognitive Functioning Concentration: Decreased Memory: Recent Intact;Remote Intact IQ: Average Insight: Poor Impulse Control: Poor Appetite: Fair Weight Loss: 0 Weight Gain: 0 Sleep: Decreased Total Hours of Sleep: 4 Vegetative Symptoms: None  ADLScreening [facility] Assessment Services) Patient's cognitive ability adequate to safely complete daily activities?: Yes Patient able to express need for assistance with ADLs?: Yes Independently performs ADLs?: Yes (appropriate for developmental age)  Abuse/Neglect [facility]) Physical Abuse: Denies Verbal Abuse: Denies Sexual Abuse: Denies  Prior Inpatient Therapy Prior Inpatient Therapy: Yes Prior Therapy Dates: Patient reports that she hasx been hospitalized at various timessince the age of 22 years old.  Prior Therapy Facilty/Provider(s): unable to remember all of them.  Reason for Treatment: Depression, SI, Bipolar Disorder   Prior Outpatient Therapy Prior Outpatient Therapy: Yes Prior Therapy Dates:  ongoing  Prior Therapy Facilty/Provider(s): Behavioral Health  Reason for Treatment: Medicatio Management   ADL Screening (condition at time of admission) Patient's cognitive ability adequate to safely complete daily activities?: Yes Patient able to express need for assistance with ADLs?: Yes Independently performs ADLs?: Yes (appropriate for developmental age)       Abuse/Neglect Assessment (Assessment to be complete while patient is alone) Physical Abuse: Denies Verbal Abuse: Denies Sexual Abuse: Denies Values / Beliefs Cultural Requests During Hospitalization: None Spiritual Requests During Hospitalization: None        Additional Information 1:1 In Past 12 Months?: No CIRT Risk: No Elopement Risk: No Does patient have medical clearance?: Yes     Disposition: Tele Psych recommends social work consult.  Disposition Initial Assessment Completed for this Encounter: Yes Disposition of Patient: Referred to Patient referred to: Other (Comment)  On Site Evaluation by:   Reviewed with Physician:     Phillip Heal LaVerne 06/08/2013 12:41 AM

## 2013-06-08 NOTE — Progress Notes (Signed)
   CARE MANAGEMENT ED NOTE 06/08/2013  Patient:  Hannah [name], Hannah [name]   Account Number:  [id number]  Date Initiated:  06/08/2013  Documentation initiated by:  Fransico Michael  Subjective/Objective Assessment:   presented to ED for medical clearance     Subjective/Objective Assessment Detail:     Action/Plan:   no PCP   Action/Plan Detail:   Anticipated DC Date:       Status Recommendation to Physician:   Result of Recommendation:      DC Planning Services  CM consult  PCP issues    Choice offered to / List presented to:            Status of service:  Completed, signed off  ED Comments:   ED Comments Detail:  06/08/13-0947-J.Doralyn Kirkes,RN,BSN [phone]      Noted that patient does not have PCP listed in Epic. In to see patient. Denies having medical insurance or PCP. List of resources given to patient by ED CM. Referral made to [place], [facility].

## 2013-06-08 NOTE — ED Notes (Signed)
Pt attempted to make call from desk phone. Pt became upset when person who she was trying to reach did not answer. Pt states "I don't know what is going on." Pt states "I just don't want to talk right now." when asked questions. Pt resting in bed with sitter at bedside. NAD noted.

## 2013-06-08 NOTE — ED Notes (Signed)
Pt visitor at bedside escorted back by security and wanded prior to coming back to pod

## 2013-06-09 ENCOUNTER — Encounter (HOSPITAL_COMMUNITY): Payer: Self-pay | Admitting: *Deleted

## 2013-06-09 ENCOUNTER — Inpatient Hospital Stay (HOSPITAL_COMMUNITY)
Admission: AD | Admit: 2013-06-09 | Discharge: 2013-06-15 | DRG: 885 | Disposition: A | Discharge status: Home / Self Care | Payer: No Typology Code available for payment source | Source: Intra-hospital | Attending: Psychiatry | Admitting: Psychiatry

## 2013-06-09 DIAGNOSIS — F909 Attention-deficit hyperactivity disorder, unspecified type: Secondary | ICD-10-CM | POA: Diagnosis present

## 2013-06-09 DIAGNOSIS — F4310 Post-traumatic stress disorder, unspecified: Secondary | ICD-10-CM | POA: Diagnosis present

## 2013-06-09 DIAGNOSIS — Z79899 Other long term (current) drug therapy: Secondary | ICD-10-CM

## 2013-06-09 DIAGNOSIS — R45851 Suicidal ideations: Secondary | ICD-10-CM

## 2013-06-09 DIAGNOSIS — F3160 Bipolar disorder, current episode mixed, unspecified: Principal | ICD-10-CM | POA: Diagnosis present

## 2013-06-09 DIAGNOSIS — G4700 Insomnia, unspecified: Secondary | ICD-10-CM | POA: Diagnosis present

## 2013-06-09 MED ORDER — LAMOTRIGINE 25 MG PO TABS
25.0000 mg | ORAL_TABLET | Freq: Every day | ORAL | Status: DC
  Administered 2013-06-09 – 2013-06-14 (×6): 25 mg via ORAL
  Filled 2013-06-09 (×9): qty 1

## 2013-06-09 MED ORDER — LORAZEPAM 1 MG PO TABS
1.0000 mg | ORAL_TABLET | Freq: Three times a day (TID) | ORAL | Status: DC | PRN
  Administered 2013-06-10 – 2013-06-12 (×3): 1 mg via ORAL
  Filled 2013-06-09 (×3): qty 1

## 2013-06-09 MED ORDER — MAGNESIUM HYDROXIDE 400 MG/5ML PO SUSP: 30.0000 mL | Freq: Every day | ORAL | Status: DC | PRN

## 2013-06-09 MED ORDER — OXYMETAZOLINE HCL 0.05 % NA SOLN
1.0000 | Freq: Two times a day (BID) | NASAL | Status: DC | PRN
  Administered 2013-06-14 (×2): 1 via NASAL

## 2013-06-09 MED ORDER — ACETAMINOPHEN 325 MG PO TABS: 650.0000 mg | ORAL_TABLET | Freq: Four times a day (QID) | ORAL | Status: DC | PRN

## 2013-06-09 MED ORDER — DIPHENHYDRAMINE HCL 25 MG PO CAPS
25.0000 mg | ORAL_CAPSULE | Freq: Once | ORAL | Status: AC
  Administered 2013-06-09: 25 mg via ORAL
  Filled 2013-06-09: qty 1

## 2013-06-09 MED ORDER — LAMOTRIGINE 25 MG PO TABS
25.0000 mg | ORAL_TABLET | Freq: Every day | ORAL | Status: DC
  Filled 2013-06-09: qty 1

## 2013-06-09 MED ORDER — ZOLPIDEM TARTRATE 5 MG PO TABS
5.0000 mg | ORAL_TABLET | Freq: Every evening | ORAL | Status: DC | PRN
  Administered 2013-06-09 – 2013-06-14 (×6): 5 mg via ORAL
  Filled 2013-06-09 (×6): qty 1

## 2013-06-09 MED ORDER — PSEUDOEPHEDRINE HCL 30 MG PO TABS
30.0000 mg | ORAL_TABLET | Freq: Three times a day (TID) | ORAL | Status: DC | PRN
  Administered 2013-06-10 – 2013-06-14 (×8): 30 mg via ORAL
  Filled 2013-06-09 (×8): qty 1

## 2013-06-09 MED ORDER — ALUM & MAG HYDROXIDE-SIMETH 200-200-20 MG/5ML PO SUSP: 30.0000 mL | ORAL | Status: DC | PRN
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
## 2013-06-09 NOTE — ED Notes (Signed)
Report given to Jan, [facility].  Pt transported with security and sitter there. Nadn.  Belongings and afrin sent with pt there.

## 2013-06-09 NOTE — ED Notes (Signed)
telepsych in progress 

## 2013-06-09 NOTE — Tx Team (Signed)
Initial Interdisciplinary Treatment Plan  PATIENT STRENGTHS: (choose at least two) Ability for insight General fund of knowledge Physical Health Supportive family/friends  PATIENT STRESSORS: Financial difficulties   PROBLEM LIST: Problem List/Patient Goals Date to be addressed Date deferred Reason deferred Estimated date of resolution  Self harm thoughts 06/09/13     Financial concern 06/09/13                                                DISCHARGE CRITERIA:  Ability to meet basic life and health needs Adequate post-discharge living arrangements Improved stabilization in mood, thinking, and/or behavior Motivation to continue treatment in a less acute level of care Need for constant or close observation no longer present Reduction of life-threatening or endangering symptoms to within safe limits Verbal commitment to aftercare and medication compliance  PRELIMINARY DISCHARGE PLAN: Outpatient therapy Return to previous living arrangement  PATIENT/FAMIILY INVOLVEMENT: This treatment plan has been presented to and reviewed with the patient, Hannah [name], and/or family member,  The patient and family have been given the opportunity to ask questions and make suggestions.  Beatrix Shipper 06/09/2013, 10:28 PM

## 2013-06-09 NOTE — ED Notes (Signed)
Patient was allowed to order her own lunch. 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 Patient got her food tray and began eating the meal . She calls and states she might have eaten a mushroom . When went into room the mushrooms are clearly visible on the food. They are quarter sized. When asked patient why she ate them she did not have an explanation.

## 2013-06-09 NOTE — ED Provider Notes (Signed)
Nursing reports patient saw a mushroom on her diet plate today but did not eat the mushroom is allergic to mushrooms and then reported she felt somewhat short of breath, or airway is clear patent and maintained her lungs are clear and unlabored she is no itching no rash no apparent anaphylaxis or allergic reaction at this time.1140  Telepsych today recs admit for persistent SI with plan.  Hurman Horn, MD 06/10/13 2103

## 2013-06-09 NOTE — ED Notes (Signed)
WAITING FOR REPEAT TELEPSYCH FOR MEDICATION RECCOMENDATON AND TREATMENT PLAN

## 2013-06-09 NOTE — BHH Counselor (Signed)
Pt admission documentation signed and faxed to [facility] Assessment Office. Denice Bors, [facility] 06/09/2013 6:29 PM

## 2013-06-09 NOTE — BH Assessment (Signed)
BHH Assessment Progress Note  Pt reviewed with Dahlia Byes, NP, who agrees to accept her to [facility] to the service of Geoffery Lyons, MD, Rm 305-2.  Per Jacquelyne Balint, RN, Administrative Coordinator, pt is not to be transferred until after 19:00.  At 15:34 I called Ranae Pila, Assessment Counselor, to notify her, but the call rolled to voicemail.  I left a message advising her to refer to this note.  Doylene Canning, MA Assessment Counselor 06/09/2013 @ 15:36

## 2013-06-09 NOTE — ED Provider Notes (Signed)
History/physical exam/procedure(s) were performed by non-physician practitioner and as supervising physician I was immediately available for consultation/collaboration. I have reviewed all notes and am in agreement with care and plan.   Vincent Streater S Kollyns Mickelson, MD 06/09/13 1245 

## 2013-06-10 DIAGNOSIS — F3160 Bipolar disorder, current episode mixed, unspecified: Secondary | ICD-10-CM | POA: Diagnosis present

## 2013-06-10 MED ORDER — LITHIUM CARBONATE 300 MG PO CAPS
300.0000 mg | ORAL_CAPSULE | Freq: Two times a day (BID) | ORAL | Status: DC
  Administered 2013-06-10 – 2013-06-15 (×11): 300 mg via ORAL
  Filled 2013-06-10 (×15): qty 1

## 2013-06-10 NOTE — Progress Notes (Signed)
Psychoeducational Group Note  Date:  06/10/2013 Time:  0945 am  Group Topic/Focus:  Making Healthy Choices:   The focus of this group is to help patients identify negative/unhealthy choices they were using prior to admission and identify positive/healthier coping strategies to replace them upon discharge.  Participation Level:  Did Not Attend   Mabry Tift J 06/10/2013, 10:29 AM 

## 2013-06-10 NOTE — Progress Notes (Signed)
Psychoeducational Group Note  Date:  06/10/2013 Time:  0945 am  Group Topic/Focus:  Making Healthy Choices:   The focus of this group is to help patients identify negative/unhealthy choices they were using prior to admission and identify positive/healthier coping strategies to replace them upon discharge.  Participation Level:  Active  Participation Quality:  Appropriate, Attentive, Sharing and Supportive  Affect:  Anxious, Appropriate and Excited  Cognitive:  Alert and Appropriate  Insight:  Engaged  Engagement in Group:  Engaged  Additional Comments:    Andrena Mews 06/10/2013, 10:29 AM
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
## 2013-06-10 NOTE — [facility] Group Notes (Signed)
[facility] Group Notes:  (Clinical Social Work)  06/10/2013   3:00-4:00PM  Summary of Progress/Problems:   The main focus of today's process group was to   identify the patient's current support system and decide on other supports that can be put in place.  Four definitions/levels of support were discussed.  An emphasis was placed on using counselor, doctor, therapy groups, 12-step groups, and problem-specific support groups to expand supports.  The group also discussed the stigma that goes along with mental illness and how to be self-protective while asking for additional supports from friends/church members/family.  The patient verbalized understanding of the importance of increasing support to prevent relapse.  She wants to add some positive growth into her life and said that she wants to go back to taking some college classes, get in touch with some past college friends, and also add some recreational classes.  She spoke up in group to talk about the non-talkers being forthcoming and that it would be helpful to them.  Type of Therapy:  Process Group  Participation Level:  Active  Participation Quality:  Attentive, Sharing and Supportive  Affect:  Appropriate  Cognitive:  Appropriate  Insight:  Engaged  Engagement in Therapy:  Engaged  Modes of Intervention:  Education,  Support and [employer], LCSW 06/10/2013, 4:29 PM
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
## 2013-06-10 NOTE — [facility] Counselor (Signed)
Received a call from a person named "Rhondi" who said that the Pt has been staying at her residence. Rhondi called to tell [facility] staff that Rai cannot return to her residence after discharge. This Clinical research associate did not confirm or deny that Juanette was a Pt at [facility].  Harlin Rain Patsy Baltimore, LPC, [facility] Assessment Counselor

## 2013-06-10 NOTE — [facility] Group Notes (Signed)
[facility] Group Notes: (Clinical Social Work)   06/10/2013      Type of Therapy:  Group Therapy   Participation Level:  Did Not Attend (LCSW sent patient from this group to [facility] hall)   Ambrose Mantle, LCSW 06/10/2013, 11:13 AM

## 2013-06-10 NOTE — Progress Notes (Signed)
D.  Pt. Reports passive SI with no plan.  Denies HI and A/V hallucinations.  Pt. Interacting appropriately with peers.  Reported depression a 5 and hopelessness 4.  Reports that her emotions are like a roller coaster going up and down. A.  Pt. Encouraged to verbalize feelings. R.  Pt. Contracts for safety.

## 2013-06-10 NOTE — Progress Notes (Signed)
Patient ID: Hannah [name], female   DOB: Apr 29, 1991, 22 y.o.   MRN: [id number] D)  Has been in her room since admission, talking to roommate and seems to be interacting appropriately with staff and peers.  Came out to med window for hs meds, currently passive thoughts but contracts for safety, talked about previous attempt and why she did it, feels has little support. Encouraged to think about establishing more sources of support, eg church, but stated she was going to church and liked the son of a couple who were helping her and they withdrew from her.   A)  Will continue to try to establish rapport, offer support, continue to monitor for safety, continue POC R)  Safety maintained.

## 2013-06-11 DIAGNOSIS — F3160 Bipolar disorder, current episode mixed, unspecified: Principal | ICD-10-CM

## 2013-06-11 MED ORDER — KETOROLAC TROMETHAMINE 30 MG/ML IJ SOLN
30.0000 mg | Freq: Once | INTRAMUSCULAR | Status: DC
  Filled 2013-06-11 (×2): qty 1

## 2013-06-11 NOTE — Progress Notes (Signed)
[facility] MD Progress Note  06/11/2013 5:33 PM Hannah [name]  MRN:  [id number] Subjective:  Very anxious, depressed. Admits that her mood fluctuates very rapidly and she cant help it. She has gotten very upset as she was not allowed to move to the [address] [place] this morning. States that she does not have substance Abuse problems. He is allowed to program in the [address] [place] but has to come back to the 300. States she connected with another patient in [facility] and felt that it was good for her as she cant make friends or have a hard time interacting with people. She states she is clear that she cant just be with this other person and that she needs to focus on her issues. She has been off "Bipolar Mediations" and is willing to pursue taking them Diagnosis:  Bipolar Disorder Mixed  ADL's:  Intact  Sleep: Poor  Appetite:  Fair  Suicidal Ideation:  Plan:  denies Intent:  denies Means:  denies Homicidal Ideation:  Plan:  denies Intent:  denies Means:  denies AEB (as evidenced by):  Psychiatric Specialty Exam: Review of Systems  Constitutional: Negative.   HENT: Negative.   Eyes: Negative.   Respiratory: Negative.   Cardiovascular: Negative.   Gastrointestinal: Negative.   Genitourinary: Negative.   Musculoskeletal: Negative.   Skin: Negative.   Neurological: Negative.   Endo/Heme/Allergies: Negative.   Psychiatric/Behavioral: Positive for depression. The patient is nervous/anxious and has insomnia.     Blood pressure 119/76, pulse 120, temperature 97.4 F (36.3 C), temperature source Oral, resp. rate 16, height 5' 1.5" (1.562 m), weight 83.462 kg (184 lb), last menstrual period 06/07/2013.Body mass index is 34.21 kg/(m^2).  General Appearance: Fairly Groomed  Patent attorney::  Fair  Speech:  Clear and Coherent and Pressured  Volume:  Increased  Mood:  Angry, Anxious, Dysphoric and Irritable  Affect:  Labile  Thought Process:  Coherent and Goal Directed  Orientation:  Full (Time, Place, and Person)   Thought Content:  worrries, concerns, ruminations  Suicidal Thoughts:  Intermittent  Homicidal Thoughts:  No  Memory:  Immediate;   Fair Recent;   Fair Remote;   Fair  Judgement:  Fair  Insight:  Present  Psychomotor Activity:  Restlessness and agitated  Concentration:  Fair  Recall:  Fair  Akathisia:  No  Handed:  Right  AIMS (if indicated):     Assets:  Desire for Improvement  Sleep:  Number of Hours: 5.75   Current Medications: Current Facility-Administered Medications  Medication Dose Route Frequency Provider Last Rate Last Dose  . acetaminophen (TYLENOL) tablet 650 mg  650 mg Oral Q6H PRN Earney Navy, NP      . alum & mag hydroxide-simeth (MAALOX/MYLANTA) 200-200-20 MG/5ML suspension 30 mL  30 mL Oral Q4H PRN Earney Navy, NP      . lamoTRIgine (LAMICTAL) tablet 25 mg  25 mg Oral QHS Earney Navy, NP   25 mg at 06/10/13 2300  . lithium carbonate capsule 300 mg  300 mg Oral BID Mike Craze, MD   300 mg at 06/11/13 0817  . LORazepam (ATIVAN) tablet 1 mg  1 mg Oral Q8H PRN Earney Navy, NP   1 mg at 06/11/13 0926  . magnesium hydroxide (MILK OF MAGNESIA) suspension 30 mL  30 mL Oral Daily PRN Earney Navy, NP      . oxymetazoline (AFRIN) 0.05 % nasal spray 1 spray  1 spray Each Nare BID PRN Earney Navy, NP      .  pseudoephedrine (SUDAFED) tablet 30 mg  30 mg Oral Q8H PRN Earney Navy, NP   30 mg at 06/11/13 0819  . zolpidem (AMBIEN) tablet 5 mg  5 mg Oral QHS PRN Earney Navy, NP   5 mg at 06/10/13 2301    Lab Results: No results found for this or any previous visit (from the past 48 hour(s)).  Physical Findings: AIMS: Facial and Oral Movements Muscles of Facial Expression: None, normal Lips and Perioral Area: None, normal Jaw: None, normal Tongue: None, normal,Extremity Movements Upper (arms, wrists, hands, fingers): None, normal Lower (legs, knees, ankles, toes): None, normal, Trunk Movements Neck, shoulders, hips:  None, normal, Overall Severity Severity of abnormal movements (highest score from questions above): None, normal Incapacitation due to abnormal movements: None, normal Patient's awareness of abnormal movements (rate only patient's report): No Awareness, Dental Status Current problems with teeth and/or dentures?: No Does patient usually wear dentures?: No  CIWA:    COWS:     Treatment Plan Summary: Daily contact with patient to assess and evaluate symptoms and progress in treatment Medication management  Plan: Supportive approach/coping skills           Pursue the Lithium and the Lamictal            Transfer to [facility] to reduce the stress and help with mood stabilization  Medical Decision Making Problem Points:  Review of psycho-social stressors (1) Data Points:  Review of medication regiment & side effects (2)  I certify that inpatient services furnished can reasonably be expected to improve the patient's condition.   Bakari Nikolai A 06/11/2013, 5:33 PM

## 2013-06-11 NOTE — Progress Notes (Signed)
Adult Psychoeducational Group Note  Date:  06/11/2013 Time:  3:06 PM  Group Topic/Focus:  Self Care:   The focus of this group is to help patients understand the importance of self-care in order to improve or restore emotional, physical, spiritual, interpersonal, and financial health.  Participation Level:  Active  Participation Quality:  Appropriate and Attentive  Affect:  Appropriate  Cognitive:  Alert  Insight: Good  Engagement in Group:  Engaged  Modes of Intervention:  Discussion and Education  Additional Comments:  Pt was very active and engaged during group session. Hannah [name] completed the self-care assessment and then gave examples of specific things that she needs to work on and other areas that she needs to improve. It was evident that Hannah [name] and another patient in the other hall, Hannah [name], have grown very close. They sat next to each other in group and worked on the assessment together. Pt mentioned that she doesn't sleep enough because it gives her too much energy. She also revealed that she wants to work on not "losing herself" in her relationships. She wants to maintain her identity. She was very active and alert during the session.   [name] 06/11/2013, 3:06 PM

## 2013-06-11 NOTE — Progress Notes (Signed)
D: Patient in the dayroom playing cards with peers on approach.  Patient states she had a good day.  Patient states she is happy that she is now on the [address].  Patient states she learns that she needs to watch her mouth.  Patient states she cursed her doctor out today but she got what she wanted by doing so.  Patient smiled when talking to Clinical research associate.  Patient had to be redirected a few times on the unit because of inappropriate conversation.  Patient is redirectable.  Patient also sitting closely with a female patient on the hallway but no appropriate behavior observed.  Patient states she has passive SI but verbally contracts for safety.  Patient denies HI and denies AVH.     A: Staff to monitor Q 15 mins for safety.  Encouragement and support offered.  Scheduled medications administered per orders.  Ambien administered prn for sleep. R: Patient remains safe on the unit.  Patient attended group tonight.  Patient visible on the unit and interacting with peers.  Patient cooperative and taking administered medications.

## 2013-06-11 NOTE — Progress Notes (Signed)
Adult Psychoeducational Group Note  Date:  06/10/2013 Time:  800 pm  Group Topic/Focus:  Wrap-Up Group:   The focus of this group is to help patients review their daily goal of treatment and discuss progress on daily workbooks.  Participation Level:  Active  Participation Quality:  Appropriate  Affect:  Appropriate  Cognitive:  Appropriate  Insight: Appropriate  Engagement in Group:  Engaged  Modes of Intervention:  Discussion  Additional Comments:  Pt reported that she was tired of cycling through the different emotions. Pt expressed that she feels she is on a big high at the present time and knows that a big low is coming soon, which is typical for her.  Pt expressed that she does not want to be on "this big wheel of emotions".  Aloria Looper, Lequita Halt A 06/11/2013, 1:07 AM

## 2013-06-11 NOTE — [facility] Group Notes (Signed)
[facility] LCSW Aftercare Discharge Planning Group Note   06/11/2013 8:45 AM  Participation Quality:  Alert and Appropriate   Mood/Affect:  Appropriate and Calm  Depression Rating:  0  Anxiety Rating: 0   Thoughts of Suicide:  Pt denies SI  Will you contract for safety?   Yes  Current AVH:  Pt denies AVH  Plan for Discharge/Comments:  Pt attended discharge planning group and actively participated in group.  CSW provided pt with today's workbook.  Pt states that she came to the hospital after being off of her medication for a year.  Pt states that she couldn't afford it.  Pt states that she will live with friends in [facility] and follow up with [facility] for medication management and therapy.  Pt reports that she is manic today but no one is listening to her; pt appears calm at this time.  Pt states that she plans to stay on her medication and utilize support groups.  No further needs voiced by pt at this time.    Transportation Means: Pt reports access to transportation  Supports: Pt names her friends as supportive  Reyes Ivan, LCSWA 06/11/2013 9:44 AM

## 2013-06-11 NOTE — Tx Team (Signed)
Interdisciplinary Treatment Plan Update (Adult)  Date: 06/11/2013  Time Reviewed:  9:45 AM  Progress in Treatment: Attending groups: Yes Participating in groups:  Yes Taking medication as prescribed:  Yes Tolerating medication:  Yes Family/Significant othe contact made: CSW assessing Patient understands diagnosis:  Yes Discussing patient identified problems/goals with staff:  Yes Medical problems stabilized or resolved:  Yes Denies suicidal/homicidal ideation: Yes Issues/concerns per patient self-inventory:  Yes Other:  New problem(s) identified: N/A  Discharge Plan or Barriers: CSW assessing for appropriate referrals.    Reason for Continuation of Hospitalization: Anxiety Depression Medication Stabilization  Comments: N/A  Estimated length of stay: 3-5 days  For review of initial/current patient goals, please see plan of care.  Attendees: Patient:     Family:     Physician:  Dr. Lugo 06/11/2013 10:13 AM   Nursing:   Donna Shimp, RN 06/11/2013 10:13 AM   Clinical Social Worker:  Kolter Reaver Horton, LCSWA 06/11/2013 10:13 AM   Other: Vivian Kent, RN 06/11/2013 10:13 AM   Other:  Heather Smart, LCSWA 06/11/2013 10:13 AM   Other:  Aggie Nwoko, NP 06/11/2013 10:13 AM   Other:     Other:    Other:    Other:    Other:    Other:    Other:     Scribe for Treatment Team:   Horton, Brienna Bass Nicole, 06/11/2013 , 10:13 AM   

## 2013-06-11 NOTE — Progress Notes (Signed)
Patient ID: Hannah [name], female   DOB: 1991/04/17, 22 y.o.   MRN: [id number] She has been up and to groups on [address]. She is unhappy because she is not allowed to hang out on the 500 hall. She is demanding to speak with th MD. He is aware.  She is loud name calling. At present she is seeing the MD.  She had cooperative till this time.

## 2013-06-11 NOTE — [facility] Group Notes (Signed)
[facility] LCSW Group Therapy  06/11/2013  1:15 PM   Type of Therapy:  Did Not Attend  Maximilian Tallo Horton, LCSWA 06/11/2013 2:39 PM   

## 2013-06-11 NOTE — Progress Notes (Signed)
Adult Psychoeducational Group Note  Date:  06/11/2013 Time:  8:00PM Group Topic/Focus:  Wrap-Up Group:   The focus of this group is to help patients review their daily goal of treatment and discuss progress on daily workbooks.  Participation Level:  Active  Participation Quality:  Appropriate and Attentive  Affect:  Appropriate  Cognitive:  Alert and Appropriate  Insight: Appropriate  Engagement in Group:  Engaged  Modes of Intervention:  Discussion  Additional Comments:  Pt. Was attentive and appropriate during tonight's group discussion. Pt shared that she had mixed feeling and emotions today. Pt stated that she need to improve on her communication. Pt wants to learn how to handle situation better.   Bing Plume D 06/11/2013, 10:02 PM

## 2013-06-11 NOTE — Progress Notes (Signed)
Recreation Therapy Notes  Date: 06.30.2014 Time: 3:00pm Location: [address] Dayroom      Group Topic/Focus: Coping Skills  Participation Level: Active  Participation Quality: Appropriate and Sharing  Affect: Euthymic  Cognitive: Appropriate  Additional Comments: Activity: Life Highlights ; Explanation: Patient was asked to think about the best moments in his/her life for approximately 60 seconds. Patient was then given 30 second to pick one moment to change in life. Patients discussed importance of devoting more time to positive moments in life v negative ones, as well as ways to remind ourselves of positivity.   Patient actively participated in group activity. Patient suggested writing positive thoughts or memoires on post-it notes and leaving them around the house to remind yourself of positivity. Patient stated a benefit of participating in leisure activities with loved ones is making positive memories. Patient shared it is difficult at times to focus on positive things in life with her diagnosis.   Marykay Lex Raneshia Derick, LRT/CTRS  Jearl Klinefelter 06/11/2013 5:23 PM

## 2013-06-11 NOTE — Progress Notes (Signed)
Patient ID: Hannah [name], female   DOB: 20-Mar-1991, 22 y.o.   MRN: [id number] D)  Has been on the 500 hall most of the evening, interacting with select peers, was seen laughing and smiling, but when she speaks to staff, her affect becomes flat, sad, guarded, and forwards little.  Stayed on the 500 hall until she was instructed to return to 300 hall by staff and dayroom ws closing.  Compliant with meds, denies pain or discomfort.   A)  Will continue to try to develop therapeutic rapport, Continue to monitor for safety, continue POC, support R)  Safety maintained.

## 2013-06-12 DIAGNOSIS — F319 Bipolar disorder, unspecified: Secondary | ICD-10-CM

## 2013-06-12 NOTE — Progress Notes (Signed)
[facility] MD Progress Note  06/12/2013 5:43 PM Hannah [name]  MRN:  [id number] Subjective:  States that she has had a hard time tolerating the Lithium and that she is concerned about keeping herself hydrated.. On the other hand, states that for the first time in a long time she did not wake up with suicidal ideas. She is willing to continue to give it a try as states she has had a hard time tolerating most other medications. She still admits to mood instability, but states she finds it easier to control Diagnosis:  Bipolar Disorder  ADL's:  Intact  Sleep: Fair  Appetite:  Fair  Suicidal Ideation:  Plan:  denies Intent:  denies Means:  denies Homicidal Ideation:  Plan:  denies Intent:  denies Means:  denies AEB (as evidenced by):  Psychiatric Specialty Exam: Review of Systems  Constitutional: Negative.   HENT: Negative.   Eyes: Negative.   Respiratory: Negative.   Cardiovascular: Negative.   Gastrointestinal: Negative.   Genitourinary: Negative.   Skin: Negative.   Neurological: Positive for dizziness.  Endo/Heme/Allergies: Negative.   Psychiatric/Behavioral: Positive for depression and suicidal ideas. The patient is nervous/anxious.     Blood pressure 106/76, pulse 96, temperature 98.5 F (36.9 C), temperature source Oral, resp. rate 20, height 5' 1.5" (1.562 m), weight 83.462 kg (184 lb), last menstrual period 06/07/2013, SpO2 98.00%.Body mass index is 34.21 kg/(m^2).  General Appearance: Fairly Groomed  Patent attorney::  Fair  Speech:  Clear and Coherent  Volume:  Normal  Mood:  Anxious and Depressed  Affect:  anxious, worried  Thought Process:  Coherent and Goal Directed  Orientation:  Full (Time, Place, and Person)  Thought Content:  symptoms, worries, concerns  Suicidal Thoughts:  Fleeting  Homicidal Thoughts:  No  Memory:  Immediate;   Fair Recent;   Fair Remote;   Fair  Judgement:  Fair  Insight:  Present, superficial  Psychomotor Activity:  Restlessness   Concentration:  Fair  Recall:  Fair  Akathisia:  No  Handed:  Right  AIMS (if indicated):     Assets:  Desire for Improvement  Sleep:  Number of Hours: 5.5   Current Medications: Current Facility-Administered Medications  Medication Dose Route Frequency Provider Last Rate Last Dose  . acetaminophen (TYLENOL) tablet 650 mg  650 mg Oral Q6H PRN Earney Navy, NP      . alum & mag hydroxide-simeth (MAALOX/MYLANTA) 200-200-20 MG/5ML suspension 30 mL  30 mL Oral Q4H PRN Earney Navy, NP      . lamoTRIgine (LAMICTAL) tablet 25 mg  25 mg Oral QHS Earney Navy, NP   25 mg at 06/11/13 2129  . lithium carbonate capsule 300 mg  300 mg Oral BID Mike Craze, MD   300 mg at 06/12/13 0801  . LORazepam (ATIVAN) tablet 1 mg  1 mg Oral Q8H PRN Earney Navy, NP   1 mg at 06/11/13 0926  . magnesium hydroxide (MILK OF MAGNESIA) suspension 30 mL  30 mL Oral Daily PRN Earney Navy, NP      . oxymetazoline (AFRIN) 0.05 % nasal spray 1 spray  1 spray Each Nare BID PRN Earney Navy, NP      . pseudoephedrine (SUDAFED) tablet 30 mg  30 mg Oral Q8H PRN Earney Navy, NP   30 mg at 06/12/13 0640  . zolpidem (AMBIEN) tablet 5 mg  5 mg Oral QHS PRN Earney Navy, NP   5 mg at 06/11/13 2129  Lab Results: No results found for this or any previous visit (from the past 48 hour(s)).  Physical Findings: AIMS: Facial and Oral Movements Muscles of Facial Expression: None, normal Lips and Perioral Area: None, normal Jaw: None, normal Tongue: None, normal,Extremity Movements Upper (arms, wrists, hands, fingers): None, normal Lower (legs, knees, ankles, toes): None, normal, Trunk Movements Neck, shoulders, hips: None, normal, Overall Severity Severity of abnormal movements (highest score from questions above): None, normal Incapacitation due to abnormal movements: None, normal Patient's awareness of abnormal movements (rate only patient's report): No Awareness, Dental  Status Current problems with teeth and/or dentures?: No Does patient usually wear dentures?: No  CIWA:  CIWA-Ar Total: 2 COWS:  COWS Total Score: 2  Treatment Plan Summary: Daily contact with patient to assess and evaluate symptoms and progress in treatment Medication management  Plan: Supportive approach/coping skills           Continue Lithium consider a CR preparation  Medical Decision Making Problem Points:  Review of psycho-social stressors (1) Data Points:  Review of medication regiment & side effects (2) Review of new medications or change in dosage (2)  I certify that inpatient services furnished can reasonably be expected to improve the patient's condition.   Adante Courington A 06/12/2013, 5:43 PM

## 2013-06-12 NOTE — Progress Notes (Signed)
D:  Patient's self inventory sheet, patient sleeps well, good appetite, hyper energy, good attention span.  Denied depression and hopelessness.  Anxiety #6.  Denied withdrawals.  Denied SI.  Has experienced pain, dizziness, headache in past 24 hours.  Zero pain goal, worst pain #4.  After discharge, plans to take meds daily.  Needs mucinex at home.   Does not have money to buy meds after discharge. A:  Medications administered per MD orders.   Emotional support and encouragement given patient. R:  Deneid SI and HI.  Denied A/V hallucinations.  Denied pain.  Contracts for safety.  Will continue to monitor for safety with 15 minute checks.   Safety maintained.  Patient stated this morning that she had nose bleed and felt dizzy.  Patient sat in chair in hallway, cold compress applied to forehead, with head tilted back.  Patient could not find blood on kleenex, another patient stated it was size of dime.  Patient's VS taken BP 116/76  T 97.9, P82 T 97.9, O2 Sat 98%.   Nurse walked with patient to group, walked patient to her room for bathroom, walked back to group.  After group patient stated she wanted to sit in dayroom and talk to peers.  No signs/pain distress noted on patient's face or body movements.  Respirations even and unlabored.  Patient stated she did not want to take any more lithium.

## 2013-06-12 NOTE — [facility] Group Notes (Signed)
[facility] LCSW Aftercare Discharge Planning Group Note   06/12/2013  8:45 AM  Participation Quality:  Alert and Appropriate   Mood/Affect:  Appropriate  Depression Rating:  0  Anxiety Rating: 10   Thoughts of Suicide:  Pt denies SI/HI  Will you contract for safety?   Yes  Current AVH:  Pt denies AVH  Plan for Discharge/Comments:  Pt attended discharge planning group and actively participated in group.  CSW provided pt with today's workbook.  Pt states that she is refusing her lithium because she feels it is making her worse and can't drink water as she was told to for this medication.  Pt states that she also had a nose bleed today.  Pt states that she will live with friends in [facility] and follow up with [facility] for medication management and therapy.  No further needs voiced by pt at this time.    Transportation Means: Pt reports access to transportation  Supports: Pt names her friends as supportive  Reyes Ivan, LCSWA 06/12/2013 10:11 AM

## 2013-06-12 NOTE — Progress Notes (Addendum)
Recreation Therapy Notes  Date: 07.01.2014 Time: 2:45pm Location: [address] Dayroom        Group Topic/Focus: Musician (AAA/T)  Participation Level: Active  Participation Quality: Appropriate   Affect: Euthymic  Cognitive: Appropriate  Additional Comments: 07.01.2014 Session = AAA Session ; Dog Team = [facility] and handler  Patient pet and visited with [place]. Patient smiled while interacting with [place]. Patient asked appropriate questions about [facility] and his training. Patient interacted with peers, LRT and dog team appropriately.   Marykay Lex Oval Cavazos, LRT/CTRS  Chandelle Harkey L 06/12/2013 4:53 PM

## 2013-06-12 NOTE — [facility] Suicide Risk Assessment (Signed)
[facility] INPATIENT:  Family/Significant Other Suicide Prevention Education  Suicide Prevention Education:  Education Completed; Hannah [name] - friend [phone]),  (name of family member/significant other) has been identified by the patient as the family member/significant other with whom the patient will be residing, and identified as the person(s) who will aid the patient in the event of a mental health crisis (suicidal ideations/suicide attempt).  With written consent from the patient, the family member/significant other has been provided the following suicide prevention education, prior to the and/or following the discharge of the patient.  The suicide prevention education provided includes the following:  Suicide risk factors  Suicide prevention and interventions  National Suicide Hotline telephone number  [facility] assessment telephone number  [facility] Emergency Assistance 911  [facility] and/or Residential Mobile Crisis Unit telephone number  Request made of family/significant other to:  Remove weapons (e.g., guns, rifles, knives), all items previously/currently identified as safety concern.    Remove drugs/medications (over-the-counter, prescriptions, illicit drugs), all items previously/currently identified as a safety concern.  The family member/significant other verbalizes understanding of the suicide prevention education information provided.  The family member/significant other agrees to remove the items of safety concern listed above. Ms. Hannah [name] states that pt isn't able to return to their home because of pt's behaviors and personality issues and lack of cleaning her part of the home.  Ms. Hannah [name] states that she hasn't told pt this info yet.  CSW will have pt call Hannah [name] to discuss this and secure housing upon d/c.    Hannah [name] 06/12/2013, 3:29 PM

## 2013-06-12 NOTE — [facility] Group Notes (Signed)
[facility] LCSW Group Therapy  06/12/2013  1:15 PM   Type of Therapy:  Group Therapy  Participation Level:  Active  Participation Quality:  Appropriate and Attentive  Affect:  Appropriate  Cognitive:  Alert and Appropriate  Insight:  Developing/Improving and Engaged  Engagement in Therapy:  Developing/Improving and Engaged  Modes of Intervention:  Clarification, Confrontation, Discussion, Education, Exploration, Limit-setting, Orientation, Problem-solving, Rapport Building, Dance movement psychotherapist, Socialization and Support  Summary of Progress/Problems: The topic for group therapy was feelings about diagnosis.  Pt actively participated in group discussion on their past and current diagnosis and how they feel towards this.  Pt also identified how society and family members judge them, based on their diagnosis as well as stereotypes and stigmas.   Pt shared that her diagnosis is bipolar disorder.  Pt states that she feels like she is on a roller coaster and was able to relate a roller coaster ride to her symptoms and behaviors, and how this effects those around her.  Pt states that she wants to get better and her son and boyfriend are her motivation to get better.  Pt states that she often can't trust herself.  Pt actively participated and was engaged in group discussion.  Prior to group starting CSW observed pt to place other patients next to her, asking the female patients to move, stating that she didn't feel comfortable next to female patients.    Reyes Ivan, [place] 06/12/2013 3:18 PM

## 2013-06-13 NOTE — Progress Notes (Signed)
Adult Psychoeducational Group Note  Date:  06/13/2013 Time:  1:40 PM  Group Topic/Focus:  Personal Choices and Values:   The focus of this group is to help patients assess and explore the importance of values in their lives, how their values affect their decisions, how they express their values and what opposes their expression.  Participation Level:  Active  Participation Quality:  Appropriate, Attentive, Sharing and Supportive  Affect:  Appropriate  Cognitive:  Appropriate  Insight: Appropriate and Good  Engagement in Group:  Engaged and Supportive  Modes of Intervention:  Discussion and Exploration  Additional Comments:  Hannah [name] mentioned that she wants to just be "okay". When she gets out she wants to attend weekly groups with her peers, people who are closer to her age. She also wants to stop isolating herself and create a "standards" list to help track her progress. She wants to use this list to help her weed out negative people and keep a positive support group.   [name] 06/13/2013, 1:40 PM

## 2013-06-13 NOTE — Progress Notes (Signed)
Adult Psychoeducational Group Note  Date:  06/13/2013 Time:  8:00PM Group Topic/Focus:  Wrap-Up Group:   The focus of this group is to help patients review their daily goal of treatment and discuss progress on daily workbooks.  Participation Level:  Active  Participation Quality:  Appropriate and Attentive  Affect:  Appropriate  Cognitive:  Alert and Appropriate  Insight: Appropriate  Engagement in Group:  Engaged  Modes of Intervention:  Discussion  Additional Comments:  Pt. Was attentive and appropriate during tonight's group discussion. Pt stated that she had a so so day. Pt stated that she was able to use stress ball. Pt was able to handle her anxiety and laugh later in the afternoon.   Bing Plume D 06/13/2013, 9:39 PM

## 2013-06-13 NOTE — Progress Notes (Signed)
D: Patient denies SI/HI and A/V hallucinations; patient reports sleep is well; reports appetite is good; reports energy level is normal ; reports ability to pay attention is good; rates depression as 0/10; rates hopelessness 0/10; rates anxiety as 3/10; patient has no complaints at this time  A: Monitored q 15 minutes; patient encouraged to attend groups; patient educated about medications; patient given medications per physician orders; patient encouraged to express feelings and/or concerns  R: Patient is cooperative but attention seeking at times; patient observed laughing and joking with one peer specifically and has some boundary issues but can be redirected; patient is assertive but appropriate at most times;  patient's interaction with staff and peers is appropriate;  patient is taking medications as prescribed and tolerating medications; patient is attending all groups and engaging

## 2013-06-13 NOTE — Tx Team (Signed)
Interdisciplinary Treatment Plan Update  Date Reviewed: 06/13/2013  Time Reviewed: 9:45 AM   Progress in Treatment:  Attending groups: Yes  Participating in groups: Yes  Taking medication as prescribed: Yes  Tolerating medication: Yes  Family/Significant other contact made: Yes Patient understands diagnosis: Yes  Discussing patient identified problems/goals with staff: Yes  Medical problems stabilized or resolved: Yes  Denies suicidal/homicidal ideation: Yes Patient has not harmed self or others: Yes   For review of initial/current patient goals, please see plan of care.   Estimated Length of Stay: 2-3 days  Reasons for Continued Hospitalization:  Anxiety  Depression  Medication stabilization   New Problems/Goals identified: N/A  Discharge Plan or Barriers: CSW assessing for appropriate referrals.    Additional Comments: N/A  Attendees:  Patient:    Signature:    Signature: Dr. Dub Mikes 06/13/2013 10:22 AM   Signature: Waynetta Sandy, RN  06/13/2013 10:21 AM   Signature: Harold Barban, RN  06/13/2013 10:21 AM   Signature: Burnetta Sabin, RN 06/13/2013 10:21 AM   Signature: Juline Patch, LCSW  06/13/2013 10:21 AM   Signature: Reyes Ivan, LCSW  06/13/2013 10:21 AM   Signature: Sharin Grave Coordinator  06/13/2013 10:21 AM   Signature: Fransisca Kaufmann, [facility]  06/13/2013 10:21 AM   Signature:    Signature:    Signature:    Scribe for Treatment Team:  Reyes Ivan, LCSWA, 06/13/2013 10:21 AM

## 2013-06-13 NOTE — [facility] Group Notes (Signed)
[facility] LCSW Group Therapy  06/13/2013  1:15 PM   Type of Therapy:  Group Therapy  Participation Level:  Active  Participation Quality:  Appropriate and Attentive  Affect:  Anxious and Appropriate  Cognitive:  Alert and Appropriate  Insight:  Developing/Improving and Engaged  Engagement in Therapy:  Developing/Improving and Engaged  Modes of Intervention:  Clarification, Confrontation, Discussion, Education, Exploration, Limit-setting, Orientation, Problem-solving, Rapport Building, Dance movement psychotherapist, Socialization and Support  Summary of Progress/Problems: .The topic for group today was emotional regulation.  This group focused on both positive and negative emotion identification and allowed group members to process ways to identify feelings, regulate negative emotions, and find healthy ways to manage internal/external emotions. Group members were asked to reflect on a time when their reaction to an emotion led to a negative outcome and explored how alternative responses using emotion regulation would have benefited them. Group members were also asked to discuss a time when emotion regulation was utilized when a negative emotion was experienced. Pt shared that she often deals with needing validation that she is loved and cared about from those around her.  Pt explained that she then thinks irrationally of how they don't love or care about her, despite their actions saying they do.  Pt was able to process this and discuss ways to change irrational thoughts to rational thoughts.  Pt actively participated and was engaged in group discussion.  Pt attempted to get out of group, stating that a female pt has been staring at her and has reported to staff and the other pt and wouldnt feel comfortable in group with him.  CSW explained that pt discharged today.  Pt continued to try to get out of group but eventually stayed.    Reyes Ivan, LCSWA 06/13/2013 3:21 PM
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
## 2013-06-13 NOTE — BHH Group Notes (Signed)
[facility] LCSW Aftercare Discharge Planning Group Note   06/13/2013  8:45 AM  Participation Quality:  Did Not Attend - pt asked immediately when CSW entered the room to not participate in group, as she feels uncomfortable in this group room.  CSW encouraged pt to stay in group and try to participate.  When it came for pt to share her progress, CSW asked pt if she contacted her friend about her living situation.  Pt states that she hasn't called them but knows she can go back there.  Pt became agitated and left the group.  CSW seeking appropriate follow up for pt.  No further needs voiced by pt at this time.     Reyes Ivan, LCSWA 06/13/2013 10:17 AM
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
## 2013-06-13 NOTE — Progress Notes (Signed)
Pt observed in the dayroom playing cards with her peers.  Pt reports she is doing better this evening.  She is more focused on another pt being upset.  Hannah [name] was encouraged to focus on her own treatment and let staff help the other patient deal with her issues.  Pt denies SI/HI/AV at this time.  Pt makes her needs known to staff.  Pt has been attending groups.  Discharge plans are incomplete for patient.  Support and encouragement offered.  Safety maintained with q15 minute checks.

## 2013-06-14 DIAGNOSIS — F4310 Post-traumatic stress disorder, unspecified: Secondary | ICD-10-CM | POA: Diagnosis present

## 2013-06-14 NOTE — BHH Group Notes (Signed)
BHH LCSW Group Therapy  [facility] 1:15 - 2:30 PM  06/14/2013. 3:37 PM   Type of Therapy:  Group Therapy  Participation Level:  Active  Participation Quality:  Attentive  Affect:  Appropriate, Depressed, Tearful  Cognitive:  Appropriate  Insight:  Developing/Improving and Engaged  Engagement in Therapy:  Developing/Improving Engaged  Modes of Intervention:  Discussion, Education, Exploration, Problem-Solving, Rapport Building, Support  Summary of Progress/Problems:  Patient listened attentively to speaker from [employer].   Patient shared speaker's story really touched her and reminded her of her own experience. She asked questions about how speaker how worked through problems and how long it had taken her.  Patient shared she would like to someday return to [facility] and present her story.  Wynn Banker 06/14/2013  3:37 PM

## 2013-06-14 NOTE — Progress Notes (Signed)
Adult Psychoeducational Group Note  Date:  06/14/2013 Time:  1:59 PM  Group Topic/Focus:  Building Self Esteem:   The Focus of this group is helping patients become aware of the effects of self-esteem on their lives, the things they and others do that enhance or undermine their self-esteem, seeing the relationship between their level of self-esteem and the choices they make and learning ways to enhance self-esteem.  Participation Level:  Active  Participation Quality:  Appropriate, Attentive, Sharing and Supportive  Affect:  Appropriate  Cognitive:  Alert, Appropriate and Oriented  Insight: Appropriate and Good  Engagement in Group:  Engaged, Improving and Supportive  Modes of Intervention:  Discussion, Socialization and Support  Additional Comments:  The purpose of this group is to identify positive self-esteem and triggers for negative self-esteem. Pt attended group and actively participated with encouragement and support to other patients. Pt stated that helping others find their strengths and having good insight helps build her self-esteem.  Caswell Corwin 06/14/2013, 1:59 PM

## 2013-06-14 NOTE — BHH Group Notes (Signed)
[facility] LCSW Aftercare Discharge Planning Group Note   06/14/2013  8:45 AM  Participation Quality:  Alert and Appropriate   Mood/Affect:  Appropriate and Calm  Depression Rating:  0  Anxiety Rating: 1-2  Thoughts of Suicide:  Pt denies SI/HI  Will you contract for safety?   Yes  Current AVH:  Pt denies AVH  Plan for Discharge/Comments:  Pt attended discharge planning group and actively participated in group.  CSW provided pt with today's workbook.  Pt reports feeling much better today, with no symptoms of depression, anxiety or mania.  Pt states that she hasn't felt this way in a long time.  Pt states that she will live with friends in [facility] and follow up with Floyd[facility] Flock and [place] for medication management and therapy.  No further needs voiced by pt at this time.    Transportation Means: Pt reports access to transportation  Supports: Pt names her friends as supports  Dawson, LCSWA 06/14/2013 10:06 AM
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
## 2013-06-14 NOTE — Progress Notes (Signed)
D: Patient denies SI/HI and A/V hallucinations; patient reports sleep is well; reports appetite is good ; reports energy level is normal ; reports ability to pay attention is good; rates depression as 0/10; rates hopelessness 0/10; rates anxiety as 0/10;   A: Monitored q 15 minutes; patient encouraged to attend groups; patient educated about medications; patient given medications per physician orders; patient encouraged to express feelings and/or concerns  R: Patient is brighter today and animated during interaction; patient's interaction with staff and peers is appropriate;  patient is taking medications as prescribed and tolerating medications; patient is attending all groups and engaging; patient is elaborating and appears to be invested

## 2013-06-14 NOTE — Progress Notes (Addendum)
Chaplain provided brief follow up support with pt in gymnasium during recreation time.   Played volleyball game with pt and peer.  Pt afterward spoke with chaplain and peer about feeling as though she has difficulty controlling her anger.   Will continue to follow for support.  Belva Crome MDiv

## 2013-06-15 MED ORDER — GUAIFENESIN ER 600 MG PO TB12: 600.0000 mg | ORAL_TABLET | Freq: Two times a day (BID) | ORAL | Status: DC

## 2013-06-15 MED ORDER — ZOLPIDEM TARTRATE 5 MG PO TABS: 5.0000 mg | ORAL_TABLET | Freq: Every evening | ORAL | Status: DC | PRN

## 2013-06-15 MED ORDER — PSEUDOEPHEDRINE HCL ER 120 MG PO TB12: 120.0000 mg | ORAL_TABLET | Freq: Two times a day (BID) | ORAL | Status: DC

## 2013-06-15 MED ORDER — LAMOTRIGINE 25 MG PO TABS: 25.0000 mg | ORAL_TABLET | Freq: Every day | ORAL | Status: DC

## 2013-06-15 MED ORDER — LITHIUM CARBONATE 300 MG PO CAPS
300.0000 mg | ORAL_CAPSULE | Freq: Two times a day (BID) | ORAL | Status: DC
  Filled 2013-06-15 (×3): qty 28

## 2013-06-15 MED ORDER — LAMOTRIGINE 25 MG PO TABS
25.0000 mg | ORAL_TABLET | Freq: Every day | ORAL | Status: DC
  Filled 2013-06-15: qty 14

## 2013-06-15 MED ORDER — LITHIUM CARBONATE 300 MG PO CAPS: 300.0000 mg | ORAL_CAPSULE | Freq: Two times a day (BID) | ORAL | Status: DC

## 2013-06-15 NOTE — Progress Notes (Signed)
[facility] Adult Case Management Discharge Plan :  Will you be returning to the same living situation after discharge: Yes,  returning home At discharge, do you have transportation home?:Yes,  access to transportation Do you have the ability to pay for your medications:Yes,  access to meds  Release of information consent forms completed and in the chart;  Patient's signature needed at discharge.  Patient to Follow up at: Follow-up Information   Follow up with [facility] On 06/19/2013. (Appointment scheduled at 1:00 pm with Dahlia Client for hospital discharge appointment)    Contact information:   [address] [place], [place] [address] Phone: [phone] Fax: [phone]      Follow up with [facility] On 06/21/2013. (Walk in on this date for hospital discharge appointment.  Walk in clinic is Monday - Friday 8 am - 3 pm. )    Contact information:   [address] [place], [place] [address] Phone: [phone] Fax: [phone]      Patient denies SI/HI:   Yes,  denies SI/HI    Safety Planning and Suicide Prevention discussed:  Yes,  discussed with pt and pt's friends.  See suicide prevention note  Hannah [name], Hannah [name] 06/15/2013, 8:44 AM

## 2013-06-15 NOTE — Progress Notes (Signed)
Patient ID: Hannah [name], female   DOB: 03/21/1991, 22 y.o.   MRN: [id number] Patient discharged per physician order; patient denies SI/HI and A/V hallucinations; patient received samples, prescriptions, and copy of AVS after it was reviewed; patient reported no other questions or concerns at this time; patient verbalized the understanding; patient verbalized and signed that she received all belongings
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
## 2013-06-15 NOTE — Discharge Summary (Signed)
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  Bipolar 1 disorder    .  ADHD (attention deficit hyperactivity disorder)    .  Heart murmur    .  Insomnia    .  Depression    .  Obesity     AXIS IV: other psychosocial or environmental problems and problems with access to health care services  AXIS V: 61-70 mild symptoms  Level of Care:  OP  Hospital Course:   Patient is a 22 year old white female with SI. Patient reports that she is not able to contract for safety. Patient reports that she does not have a plan. Patient received a Tele Psych that recommends ok to discharge with case management and referrals from social work. Patient reports a prior diagnosis of Bipolar Disorder and ADHD. Patient reports that she has not been on her medication for some time knows. Patient reports that she is unsure when she took her medication last. Patient reports insomnia, depression, history of suicide attempts and self-harm.  Patient reports the last time that she cut herself was when she was 22 years old. Patient reports an extensive history of psychiatric hospitalizations in [place]. Patient reports that she needs to be placed back on some type of medication that will not cause her to "black out and do something bad" if she goes back on those old medications for her Bipolar Disorder.   While a patient in this hospital, Hannah [name] was enrolled in group counseling and activities as well as received the following  medication Current facility-administered medications:acetaminophen (TYLENOL) tablet 650 mg, 650 mg, Oral, Q6H PRN, Earney Navy, NP;  alum & mag hydroxide-simeth (MAALOX/MYLANTA) 200-200-20 MG/5ML suspension 30 mL, 30 mL, Oral, Q4H PRN, Earney Navy, NP;  lamoTRIgine (LAMICTAL) tablet 25 mg, 25 mg, Oral, QHS, Earney Navy, NP, 25 mg at 06/14/13 2138 lithium carbonate capsule 300 mg, 300 mg, Oral, BID, Mike Craze, MD, 300 mg at 06/15/13 0751;  LORazepam (ATIVAN) tablet 1 mg, 1 mg, Oral, Q8H PRN, Earney Navy, NP, 1 mg at 06/12/13 2045;  magnesium hydroxide (MILK OF MAGNESIA) suspension 30 mL, 30 mL, Oral, Daily PRN, Earney Navy, NP;  oxymetazoline (AFRIN) 0.05 % nasal spray 1 spray, 1 spray, Each Nare, BID PRN, Earney Navy, NP, 1 spray at 06/14/13 1951 pseudoephedrine (SUDAFED) tablet 30 mg, 30 mg, Oral, Q8H PRN, Earney Navy, NP, 30 mg at 06/14/13 1624;  zolpidem (AMBIEN) tablet 5 mg, 5 mg, Oral, QHS PRN, Earney Navy, NP, 5 mg at 06/14/13 2140 Current outpatient prescriptions:guaiFENesin (MUCINEX) 600 MG 12 hr tablet, Take 1 tablet (600 mg total) by mouth 2 (two) times daily., Disp: , Rfl: ;  lamoTRIgine (LAMICTAL) 25 MG tablet, Take 1 tablet (25 mg total) by mouth at bedtime., Disp: 30 tablet, Rfl: 0;  lithium carbonate 300 MG capsule, Take 1 capsule (300 mg total) by mouth 2 (two) times daily. For mood stability, Disp: 60 capsule,  Rfl: 0 pseudoephedrine (SUDAFED) 120 MG 12 hr tablet, Take 1 tablet (120 mg total) by mouth every 12 (twelve) hours., Disp: , Rfl: ;  zolpidem (AMBIEN) 5 MG tablet, Take 1 tablet (5 mg total) by mouth at bedtime as needed for sleep., Disp: 30 tablet, Rfl: 0 The patient had her medications adjusted by the MD during her hospital stay. She was started on Lamictal 25 mg daily and Lithium 300 mg BID for increased mood stability. Patient will have repeat lithium levels drawn at the discretion of the outpatient provider. Her level was  approaching therapeutic range at the time of her discharge. Hannah [name] was showing improved mood stability and was found stable enough for discharge. Patient attended treatment team meeting this am and met with treatment team members. Pt symptoms, treatment plan and response to treatment discussed. Hannah [name] endorsed that their symptoms have improved. Pt also stated that they are stable for discharge.  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 They will follow-up at  Follow-up Information   Follow up with [facility] On 06/19/2013. (Appointment scheduled at 1:00 pm with Dahlia Client for hospital discharge appointment)    Contact information:   [address] [place], [place] [address] Phone: [phone] Fax: [phone]      Follow up with [facility] On 06/21/2013. (Walk in on this date for hospital discharge appointment.  Walk in clinic is Monday - Friday 8 am - 3 pm. )    Contact information:   [address] [place] [address] Phone: [phone] Fax: [phone]    .  In addition they were instructed to take all your medications as prescribed by your mental healthcare provider, to report any adverse effects and or reactions from your medicines to your outpatient provider promptly, patient is instructed and cautioned to not engage in alcohol and or illegal drug use while on prescription medicines, in the event of worsening symptoms, patient is instructed to call the crisis hotline, 911 and or go to the nearest ED for appropriate evaluation and treatment of symptoms.   Upon discharge, patient adamantly denies suicidal, homicidal ideations, auditory, visual hallucinations and or delusional thinking. They left [facility] with all personal belongings in no apparent distress.  Consults:  See electronic record for details  Significant Diagnostic Studies:  See  electronic record for details  Discharge Vitals:   Blood pressure 124/73, pulse 82, temperature 97.8 F (36.6 C), temperature source Oral, resp. rate 16, height 5' 1.5" (1.562 m), weight 83.462 kg (184 lb), last menstrual period 06/07/2013, SpO2 98.00%..  Mental Status Exam: See Mental Status Examination and Suicide Risk Assessment completed by Attending Physician prior to discharge.  Discharge destination:  Home  Is patient on multiple antipsychotic therapies at discharge:  No  Has Patient had three or more failed trials of antipsychotic monotherapy by history: N/A Recommended Plan for Multiple Antipsychotic Therapies: N/A Discharge Orders   Future Orders Complete By Expires     Activity as tolerated - No restrictions  As directed         Medication List       Indication   guaiFENesin 600 MG 12 hr tablet  Commonly known as:  MUCINEX  Take 1 tablet (600 mg total) by mouth 2 (two) times daily.   Indication:  Cough     lamoTRIgine 25 MG tablet  Commonly known as:  LAMICTAL  Take 1 tablet (25 mg total) by mouth at bedtime.   Indication:  Depression     lithium carbonate 300 MG capsule  Take 1 capsule (300 mg total) by mouth 2 (two) times daily. For mood stability   Indication:  Manic-Depression, Depression     pseudoephedrine 120 MG 12 hr tablet  Commonly known as:  SUDAFED  Take 1 tablet (120 mg total) by mouth every 12 (twelve) hours.   Indication:  Stuffy Nose     zolpidem 5 MG tablet  Commonly known as:  AMBIEN  Take 1 tablet (5 mg total) by mouth at bedtime as needed for sleep.   Indication:  Trouble Sleeping           Follow-up Information   Follow up with [facility] On 06/19/2013. (Appointment scheduled at 1:00 pm with Dahlia Client for hospital discharge appointment)    Contact information:   [address] [place], [place] [address] Phone: [phone] Fax: [phone]      Follow up with [facility] On 06/21/2013. (Walk in on this date for hospital discharge appointment.   Walk in clinic is Monday - Friday 8 am - 3 pm. )    Contact information:   [address], [place] [address] Phone: [phone] Fax: [phone]     Follow-up recommendations:   Activities: Resume typical activities Diet: Resume typical diet Tests: none Other: Follow up with outpatient provider and report any side effects to out patient prescriber. Continue to work on the life style changes to help management of your mood Comments:  Take all your medications as prescribed by your mental healthcare provider. Report any adverse effects and or reactions from your medicines to your outpatient provider promptly. Patient is instructed and cautioned to not engage in alcohol and or illegal drug use while on prescription medicines. In the event of worsening symptoms, patient is instructed to call the crisis hotline, 911 and or go to the nearest ED for appropriate evaluation and treatment of symptoms. Follow-up with your primary care provider for your other medical issues, concerns and or health care needs.  SignedFransisca Kaufmann NP-C 06/15/2013 4:06 PM  Agree with assessment and plan Madie Reno A. Dub Mikes, M.D.

## 2013-06-15 NOTE — Progress Notes (Signed)
D: Pt in bed resting with eyes closed. Respirations even and unlabored. Pt appears to be in no signs of distress at this time. A: Q15min checks remains for this pt. R: Pt remains safe at this time.   

## 2013-06-19 NOTE — Progress Notes (Signed)
Patient Discharge Instructions:  After Visit Summary (AVS):   Faxed to:  06/19/13 Discharge Summary Note:   Faxed to:  06/19/13 Psychiatric Admission Assessment Note:   Faxed to:  06/19/13 Suicide Risk Assessment - Discharge Assessment:   Faxed to:  06/19/13 Faxed/Sent to the Next Level Care provider:  06/19/13 Faxed to [facility] @ [phone] Faxed to [facility] @ [phone]  Jerelene Redden, 06/19/2013, 2:57 PM

## 2019-03-12 ENCOUNTER — Other Ambulatory Visit: Payer: Self-pay

## 2019-03-12 ENCOUNTER — Emergency Department (HOSPITAL_COMMUNITY)
Admission: EM | Admit: 2019-03-12 | Discharge: 2019-03-12 | Disposition: A | Discharge status: Home / Self Care | Payer: Medicaid Other | Attending: Emergency Medicine | Admitting: Emergency Medicine

## 2019-03-12 ENCOUNTER — Encounter (HOSPITAL_COMMUNITY): Payer: Self-pay

## 2019-03-12 DIAGNOSIS — R21 Rash and other nonspecific skin eruption: Secondary | ICD-10-CM | POA: Diagnosis present

## 2019-03-12 DIAGNOSIS — Z79899 Other long term (current) drug therapy: Secondary | ICD-10-CM | POA: Insufficient documentation

## 2019-03-12 DIAGNOSIS — F908 Attention-deficit hyperactivity disorder, other type: Secondary | ICD-10-CM | POA: Diagnosis not present

## 2019-03-12 DIAGNOSIS — Z91040 Latex allergy status: Secondary | ICD-10-CM | POA: Insufficient documentation

## 2019-03-12 DIAGNOSIS — L239 Allergic contact dermatitis, unspecified cause: Secondary | ICD-10-CM | POA: Diagnosis not present

## 2019-03-12 MED ORDER — PREDNISONE 10 MG PO TABS: ORAL_TABLET | ORAL | 0 refills | Status: AC

## 2019-03-12 MED ORDER — CETIRIZINE HCL 10 MG PO TABS: 10.0000 mg | ORAL_TABLET | Freq: Every day | ORAL | 0 refills | Status: DC

## 2019-03-12 MED ORDER — HYDROXYZINE HCL 25 MG PO TABS: 25.0000 mg | ORAL_TABLET | Freq: Four times a day (QID) | ORAL | 0 refills | Status: DC

## 2019-03-12 NOTE — ED Triage Notes (Signed)
Pt BIB EMS from [facility]. Pt has rash on left side x2 days. Redness and rash noted. Pt received 50 mg oral Benadryl in route.  132/88 HR 84 RR 16 Temp 97.8 98% RA

## 2019-03-12 NOTE — ED Provider Notes (Signed)
[facility]-EMERGENCY DEPT Provider Note   CSN: [id number] Arrival date & time: 03/12/19  1319    History   Chief Complaint Chief Complaint  Patient presents with  . Rash    HPI Hannah [name] is a 28 y.o. female.     The history is provided by the patient. No language interpreter was used.  Rash   Hannah [name] is a 28 y.o. female who presents to the Emergency Department complaining of rash. She presents to the emergency department via EMS complaining of itchy rash since Thursday. She is currently homeless and staying in a tent. No known sick contacts or insect exposures. Over the last several days she has experienced increased itching to the left side of her abdomen and chest. No prior similar symptoms. She denies any fevers, coughing, shortness of breath, nausea, vomiting. Symptoms are mild to moderate and constant nature. Past Medical History:  Diagnosis Date  . ADHD (attention deficit hyperactivity disorder)   . Bipolar 1 disorder (HCC)   . Depression   . Heart murmur   . Insomnia   . Obesity     Patient Active Problem List   Diagnosis Date Noted  . PTSD (post-traumatic stress disorder) 06/14/2013  . 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 DILATION AND CURETTAGE OF UTERUS    . FRACTURE SURGERY Right    arm from compound fracture     OB History   No obstetric history on file.      Home Medications    Prior to Admission medications   Medication Sig Start Date End Date Taking? Authorizing Provider  cetirizine (ZYRTEC) 10 MG tablet Take 1 tablet (10 mg total) by mouth daily. 03/12/19   Tilden Fossa, MD  guaiFENesin (MUCINEX) 600 MG 12 hr tablet Take 1 tablet (600 mg total) by mouth 2 (two) times daily. 06/15/13   Thermon Leyland, NP  hydrOXYzine (ATARAX/VISTARIL) 25 MG tablet Take 1 tablet (25 mg total) by mouth every 6 (six) hours. 03/12/19   Tilden Fossa, MD   lamoTRIgine (LAMICTAL) 25 MG tablet Take 1 tablet (25 mg total) by mouth at bedtime. 06/15/13   Thermon Leyland, NP  lithium carbonate 300 MG capsule Take 1 capsule (300 mg total) by mouth 2 (two) times daily. For mood stability 06/15/13   Thermon Leyland, NP  predniSONE (DELTASONE) 10 MG tablet Take 3 tablets (30 mg total) by mouth daily for 4 days, THEN 2 tablets (20 mg total) daily for 4 days, THEN 1 tablet (10 mg total) daily for 4 days. 03/12/19 03/24/19  Tilden Fossa, MD  pseudoephedrine (SUDAFED) 120 MG 12 hr tablet Take 1 tablet (120 mg total) by mouth every 12 (twelve) hours. 06/15/13   Thermon Leyland, NP  zolpidem (AMBIEN) 5 MG tablet Take 1 tablet (5 mg total) by mouth at bedtime as needed for sleep. 06/15/13   Thermon Leyland, NP    Family History History reviewed. No pertinent family history.  Social History Social History   Tobacco Use  . Smoking status: Never Smoker  Substance Use Topics  . Alcohol use: Yes    Comment: occ  . Drug use: No     Allergies   Demerol [meperidine]; Mushroom extract complex; and Latex   Review of Systems Review of Systems  Skin: Positive for rash.  All other systems reviewed and are negative.    Physical Exam Updated Vital Signs  There were no vitals taken for this visit.  Physical Exam Vitals signs and nursing note reviewed.  Constitutional:      Appearance: She is well-developed.  HENT:     Head: Normocephalic and atraumatic.  Cardiovascular:     Rate and Rhythm: Normal rate and regular rhythm.  Pulmonary:     Effort: Pulmonary effort is normal. No respiratory distress.  Abdominal:     Palpations: Abdomen is soft.     Tenderness: There is no abdominal tenderness. There is no guarding or rebound.  Musculoskeletal:        General: No swelling or tenderness.  Skin:    General: Skin is warm and dry.          Comments: Erythematous rash to the left hemi abdomen and lower left axillary chest wall. Rash has multiple tiny vesicles and  overlying excoriation's.  Neurological:     Mental Status: She is alert and oriented to person, place, and time.  Psychiatric:        Behavior: Behavior normal.      ED Treatments / Results  Labs (all labs ordered are listed, but only abnormal results are displayed) Labs Reviewed - No data to display  EKG None  Radiology No results found.  Procedures Procedures (including critical care time)  Medications Ordered in ED Medications - No data to display   Initial Impression / Assessment and Plan / ED Course  I have reviewed the triage vital signs and the nursing notes.  Pertinent labs & imaging results that were available during my care of the patient were reviewed by me and considered in my medical decision making (see chart for details).        Patient presents to the emergency department complaining of itchy rash. Rash is not consistent with cellulitis, abscess, SJS, zoster, scabies. Concern for potential contact dermatitis. Discussed with patient home care, outpatient follow-up and return precautions.  Final Clinical Impressions(s) / ED Diagnoses   Final diagnoses:  Allergic contact dermatitis, unspecified trigger  Rash    ED Discharge Orders         Ordered    predniSONE (DELTASONE) 10 MG tablet     03/12/19 1334    cetirizine (ZYRTEC) 10 MG tablet  Daily     03/12/19 1334    hydrOXYzine (ATARAX/VISTARIL) 25 MG tablet  Every 6 hours     03/12/19 1334           Tilden Fossa, MD 03/12/19 1337

## 2019-03-12 NOTE — Discharge Instructions (Signed)
Get rechecked if you develop fevers, worsening symptoms.

## 2019-03-12 NOTE — ED Notes (Signed)
Bed: RS85 Expected date:  Expected time:  Means of arrival:  Comments: EMS-rash

## 2019-07-15 ENCOUNTER — Emergency Department (HOSPITAL_COMMUNITY)
Admission: EM | Admit: 2019-07-15 | Discharge: 2019-07-15 | Disposition: A | Discharge status: Home / Self Care | Payer: Medicaid Other | Attending: Emergency Medicine | Admitting: Emergency Medicine

## 2019-07-15 ENCOUNTER — Other Ambulatory Visit: Payer: Self-pay

## 2019-07-15 ENCOUNTER — Encounter (HOSPITAL_COMMUNITY): Payer: Self-pay

## 2019-07-15 DIAGNOSIS — Z91040 Latex allergy status: Secondary | ICD-10-CM | POA: Diagnosis not present

## 2019-07-15 DIAGNOSIS — R011 Cardiac murmur, unspecified: Secondary | ICD-10-CM | POA: Insufficient documentation

## 2019-07-15 DIAGNOSIS — Z79899 Other long term (current) drug therapy: Secondary | ICD-10-CM | POA: Diagnosis not present

## 2019-07-15 DIAGNOSIS — L259 Unspecified contact dermatitis, unspecified cause: Secondary | ICD-10-CM | POA: Insufficient documentation

## 2019-07-15 DIAGNOSIS — Z885 Allergy status to narcotic agent status: Secondary | ICD-10-CM | POA: Diagnosis not present

## 2019-07-15 DIAGNOSIS — N939 Abnormal uterine and vaginal bleeding, unspecified: Secondary | ICD-10-CM | POA: Insufficient documentation

## 2019-07-15 LAB — CBC WITH DIFFERENTIAL/PLATELET
Abs Immature Granulocytes: 0.03 10*3/uL (ref 0.00–0.07)
Basophils Absolute: 0 10*3/uL (ref 0.0–0.1)
Basophils Relative: 0 %
Eosinophils Absolute: 0.1 10*3/uL (ref 0.0–0.5)
Eosinophils Relative: 2 %
HCT: 43.8 % (ref 36.0–46.0)
Hemoglobin: 13.7 g/dL (ref 12.0–15.0)
Immature Granulocytes: 1 %
Lymphocytes Relative: 23 %
Lymphs Abs: 1.3 10*3/uL (ref 0.7–4.0)
MCH: 27.4 pg (ref 26.0–34.0)
MCHC: 31.3 g/dL (ref 30.0–36.0)
MCV: 87.6 fL (ref 80.0–100.0)
Monocytes Absolute: 0.4 10*3/uL (ref 0.1–1.0)
Monocytes Relative: 7 %
Neutro Abs: 3.6 10*3/uL (ref 1.7–7.7)
Neutrophils Relative %: 67 %
Platelets: 251 10*3/uL (ref 150–400)
RBC: 5 MIL/uL (ref 3.87–5.11)
RDW: 14.6 % (ref 11.5–15.5)
WBC: 5.4 10*3/uL (ref 4.0–10.5)
nRBC: 0 % (ref 0.0–0.2)

## 2019-07-15 LAB — BASIC METABOLIC PANEL
Anion gap: 9 (ref 5–15)
BUN: 13 mg/dL (ref 6–20)
CO2: 23 mmol/L (ref 22–32)
Calcium: 8.8 mg/dL — ABNORMAL LOW (ref 8.9–10.3)
Chloride: 108 mmol/L (ref 98–111)
Creatinine, Ser: 0.86 mg/dL (ref 0.44–1.00)
GFR calc Af Amer: >60 mL/min (ref >60)
GFR calc non Af Amer: >60 mL/min (ref >60)
Glucose, Bld: 84 mg/dL (ref 70–99)
Potassium: 3.9 mmol/L (ref 3.5–5.1)
Sodium: 140 mmol/L (ref 135–145)

## 2019-07-15 LAB — WET PREP, GENITAL
Clue Cells Wet Prep HPF POC: NONE SEEN
Sperm: NONE SEEN
Trich, Wet Prep: NONE SEEN
Yeast Wet Prep HPF POC: NONE SEEN

## 2019-07-15 LAB — URINALYSIS, ROUTINE W REFLEX MICROSCOPIC
Bilirubin Urine: NEGATIVE
Glucose, UA: NEGATIVE mg/dL
Ketones, ur: NEGATIVE mg/dL
Nitrite: NEGATIVE
Protein, ur: 30 mg/dL — AB
Specific Gravity, Urine: 1.028 (ref 1.005–1.030)
pH: 5 (ref 5.0–8.0)

## 2019-07-15 LAB — POC URINE PREG, ED: Preg Test, Ur: NEGATIVE

## 2019-07-15 MED ORDER — METHYLPREDNISOLONE 4 MG PO TBPK: ORAL_TABLET | ORAL | 0 refills | Status: DC

## 2019-07-15 NOTE — ED Provider Notes (Signed)
[facility] DEPT Provider Note   CSN: [id number] Arrival date & time: 07/15/19  1050     History   Chief Complaint Chief Complaint  Patient presents with  . Rash  . Vaginal Bleeding    HPI Hannah [name] is a 28 y.o. female.  Emergency department chief complaint of vaginal bleeding and rash.  Patient has had 1 month of persistent vaginal bleeding.  She was seen at [facility] emergency department and had pelvic examination was found to have bacterial vaginosis and treated with a full course of Flagyl.  She states that she continues have pelvic pain and bleeding.  She denies foul odor or discharge.  Patient also complains of very pruritic rash over her arms and legs.  She and her fianc are currently currently homeless living in a [place] the [place].  She denies pain in the rash.  She denies discharge or fevers.     HPI  Past Medical History:  Diagnosis Date  . ADHD (attention deficit hyperactivity disorder)   . Bipolar 1 disorder ([place])   . Depression   . Heart murmur   . Insomnia   . Obesity     Patient Active Problem List   Diagnosis Date Noted  . PTSD (post-traumatic stress disorder) 06/14/2013  . Mixed bipolar I disorder with rapid cycling ([place]) 06/10/2013    Past Surgical History:  Procedure Laterality Date  . DILATION AND CURETTAGE OF UTERUS    . FRACTURE SURGERY Right    arm from compound fracture     OB History   No obstetric history on file.      Home Medications    Prior to Admission medications   Medication Sig Start Date End Date Taking? Authorizing Provider  b complex vitamins tablet Take 1 tablet by mouth daily.   Yes [provider]  gabapentin (NEURONTIN) 300 MG capsule Take 300 mg by mouth 2 (two) times daily.   Yes [provider]  ibuprofen (ADVIL) 200 MG tablet Take 400 mg by mouth every 6 (six) hours as needed for mild pain.   Yes [provider]  VRAYLAR 4.5 MG CAPS Take 4.5 mg by mouth  daily.  05/09/19  Yes [provider]  cetirizine (ZYRTEC) 10 MG tablet Take 1 tablet (10 mg total) by mouth daily. Patient not taking: Reported on 07/15/2019 03/12/19   Quintella Reichert, MD  guaiFENesin (MUCINEX) 600 MG 12 hr tablet Take 1 tablet (600 mg total) by mouth 2 (two) times daily. Patient not taking: Reported on 07/15/2019 06/15/13   Niel Hummer, NP  hydrOXYzine (ATARAX/VISTARIL) 25 MG tablet Take 1 tablet (25 mg total) by mouth every 6 (six) hours. Patient not taking: Reported on 07/15/2019 03/12/19   Quintella Reichert, MD  lamoTRIgine (LAMICTAL) 25 MG tablet Take 1 tablet (25 mg total) by mouth at bedtime. Patient not taking: Reported on 07/15/2019 06/15/13   Niel Hummer, NP  lithium carbonate 300 MG capsule Take 1 capsule (300 mg total) by mouth 2 (two) times daily. For mood stability Patient not taking: Reported on 07/15/2019 06/15/13   Niel Hummer, NP  pseudoephedrine (SUDAFED) 120 MG 12 hr tablet Take 1 tablet (120 mg total) by mouth every 12 (twelve) hours. Patient not taking: Reported on 07/15/2019 06/15/13   Niel Hummer, NP  zolpidem (AMBIEN) 5 MG tablet Take 1 tablet (5 mg total) by mouth at bedtime as needed for sleep. Patient not taking: Reported on 07/15/2019 06/15/13   Elmarie Shiley  A, NP    Family History No family history on file.  Social History Social History   Tobacco Use  . Smoking status: Never Smoker  Substance Use Topics  . Alcohol use: Yes    Comment: occ  . Drug use: No     Allergies   Demerol [meperidine], Mushroom extract complex, and Latex   Review of Systems Review of Systems Ten systems reviewed and are negative for acute change, except as noted in the HPI.    Physical Exam Updated Vital Signs BP 116/70   Pulse (!) 108   Temp 98.6 F (37 C)   Resp 14   Wt 94.8 kg   LMP 06/14/2019   SpO2 100%   BMI 38.85 kg/m   Physical Exam Vitals signs and nursing note reviewed. Exam conducted with a chaperone present.  Constitutional:       General: She is not in acute distress.    Appearance: She is well-developed. She is not diaphoretic.  HENT:     Head: Normocephalic and atraumatic.  Eyes:     General: No scleral icterus.    Conjunctiva/sclera: Conjunctivae normal.  Neck:     Musculoskeletal: Normal range of motion.  Cardiovascular:     Rate and Rhythm: Normal rate and regular rhythm.     Heart sounds: Normal heart sounds. No murmur. No friction rub. No gallop.   Pulmonary:     Effort: Pulmonary effort is normal. No respiratory distress.     Breath sounds: Normal breath sounds.  Abdominal:     General: Bowel sounds are normal. There is no distension.     Palpations: Abdomen is soft. There is no mass.     Tenderness: There is no abdominal tenderness. There is no guarding.  Genitourinary:    Comments: Patient with normal external genitalia.  No vaginal discharge noted.  Bleeding from the cervical loss present.  Diffusely tender in the pelvis without masses or overt CMT. Skin:    General: Skin is warm and dry.     Comments: Rash on the right upper extremity and right lower extremity with vesicular, confluent eruption in various states.  Erythema and crusting noted, no tenderness or induration  Neurological:     Mental Status: She is alert and oriented to person, place, and time.  Psychiatric:        Behavior: Behavior normal.      ED Treatments / Results  Labs (all labs ordered are listed, but only abnormal results are displayed) Labs Reviewed  URINALYSIS, ROUTINE W REFLEX MICROSCOPIC - Abnormal; Notable for the following components:      Result Value   Color, Urine AMBER (*)    Hgb urine dipstick LARGE (*)    Protein, ur 30 (*)    Leukocytes,Ua TRACE (*)    Bacteria, UA RARE (*)    All other components within normal limits  WET PREP, GENITAL  RPR  HIV ANTIBODY (ROUTINE TESTING W REFLEX)  POC URINE PREG, ED  GC/CHLAMYDIA PROBE AMP ([place]    EKG None  Radiology No results found.   Procedures Procedures (including critical care time)  Medications Ordered in ED Medications - No data to display   Initial Impression / Assessment and Plan / ED Course  I have reviewed the triage vital signs and the nursing notes.  Pertinent labs & imaging results that were available during my care of the patient were reviewed by me and considered in my medical decision making (see chart  for details).        Patient given over in signout to Dr. Rhunette CroftNanavati to follow-up on lab findings.  Her rash is consistent with poison oak or ivy dermatitis.  Feel the patient may need prednisone Dosepak and topical treatments.  Her labs are otherwise pending.  Overall her pelvic exam is benign and will check CBC and basic chemistry panel make sure she does not have any significant anemia.  Final Clinical Impressions(s) / ED Diagnoses   Final diagnoses:  Vaginal bleeding  Contact dermatitis, unspecified contact dermatitis type, unspecified trigger    ED Discharge Orders    None       Arthor CaptainHarris, Betzayda Braxton, PA-C 07/15/19 1542    Derwood KaplanNanavati, Ankit, MD 07/18/19 1717

## 2019-07-15 NOTE — ED Triage Notes (Signed)
Pt is concerned for MRSA. Pt has reddened areas notes to right wrist, left lower leg and right lower leg. Pt first noticed 1 week ago. Pt states that it itches all the time.  Pt also states she has had vaginal bleeding x 1 month. Pt states bright red with a few clots.

## 2019-07-15 NOTE — Discharge Instructions (Addendum)
Follow-up with women's outpatient clinic for the bleeding. Medrol Dosepak given for the dermatitis/allergic reaction.

## 2019-07-16 LAB — HIV ANTIBODY (ROUTINE TESTING W REFLEX): HIV Screen 4th Generation wRfx: NONREACTIVE

## 2019-07-17 LAB — RPR: RPR Ser Ql: NONREACTIVE

## 2019-07-17 LAB — GC/CHLAMYDIA PROBE AMP (~~LOC~~) NOT AT ARMC
Chlamydia: NEGATIVE
Neisseria Gonorrhea: NEGATIVE

## 2019-08-25 ENCOUNTER — Emergency Department (HOSPITAL_COMMUNITY): Payer: Medicaid Other

## 2019-08-25 ENCOUNTER — Encounter (HOSPITAL_COMMUNITY): Payer: Self-pay | Admitting: *Deleted

## 2019-08-25 ENCOUNTER — Other Ambulatory Visit: Payer: Self-pay

## 2019-08-25 ENCOUNTER — Emergency Department (HOSPITAL_COMMUNITY)
Admission: EM | Admit: 2019-08-25 | Discharge: 2019-08-25 | Disposition: A | Discharge status: Other Acute Inpatient Hospital | Payer: Medicaid Other | Attending: Emergency Medicine | Admitting: Emergency Medicine

## 2019-08-25 DIAGNOSIS — Z79899 Other long term (current) drug therapy: Secondary | ICD-10-CM | POA: Insufficient documentation

## 2019-08-25 DIAGNOSIS — F314 Bipolar disorder, current episode depressed, severe, without psychotic features: Secondary | ICD-10-CM | POA: Insufficient documentation

## 2019-08-25 DIAGNOSIS — R45851 Suicidal ideations: Secondary | ICD-10-CM | POA: Diagnosis not present

## 2019-08-25 DIAGNOSIS — Z20828 Contact with and (suspected) exposure to other viral communicable diseases: Secondary | ICD-10-CM | POA: Insufficient documentation

## 2019-08-25 DIAGNOSIS — Z91040 Latex allergy status: Secondary | ICD-10-CM | POA: Insufficient documentation

## 2019-08-25 DIAGNOSIS — F3160 Bipolar disorder, current episode mixed, unspecified: Secondary | ICD-10-CM | POA: Diagnosis present

## 2019-08-25 LAB — COMPREHENSIVE METABOLIC PANEL
ALT: 16 U/L (ref 0–44)
AST: 17 U/L (ref 15–41)
Albumin: 3.9 g/dL (ref 3.5–5.0)
Alkaline Phosphatase: 78 U/L (ref 38–126)
Anion gap: 13 (ref 5–15)
BUN: 8 mg/dL (ref 6–20)
CO2: 21 mmol/L — ABNORMAL LOW (ref 22–32)
Calcium: 9.1 mg/dL (ref 8.9–10.3)
Chloride: 107 mmol/L (ref 98–111)
Creatinine, Ser: 0.65 mg/dL (ref 0.44–1.00)
GFR calc Af Amer: >60 mL/min (ref >60)
GFR calc non Af Amer: >60 mL/min (ref >60)
Glucose, Bld: 105 mg/dL — ABNORMAL HIGH (ref 70–99)
Potassium: 3.5 mmol/L (ref 3.5–5.1)
Sodium: 141 mmol/L (ref 135–145)
Total Bilirubin: 0.2 mg/dL — ABNORMAL LOW (ref 0.3–1.2)
Total Protein: 7.4 g/dL (ref 6.5–8.1)

## 2019-08-25 LAB — CBC
HCT: 41.2 % (ref 36.0–46.0)
Hemoglobin: 13.2 g/dL (ref 12.0–15.0)
MCH: 27.5 pg (ref 26.0–34.0)
MCHC: 32 g/dL (ref 30.0–36.0)
MCV: 85.8 fL (ref 80.0–100.0)
Platelets: 285 10*3/uL (ref 150–400)
RBC: 4.8 MIL/uL (ref 3.87–5.11)
RDW: 14.7 % (ref 11.5–15.5)
WBC: 7.3 10*3/uL (ref 4.0–10.5)
nRBC: 0 % (ref 0.0–0.2)

## 2019-08-25 LAB — RAPID URINE DRUG SCREEN, HOSP PERFORMED
Amphetamines: NOT DETECTED
Barbiturates: NOT DETECTED
Benzodiazepines: NOT DETECTED
Cocaine: NOT DETECTED
Opiates: NOT DETECTED
Tetrahydrocannabinol: POSITIVE — AB

## 2019-08-25 LAB — I-STAT BETA HCG BLOOD, ED (MC, WL, AP ONLY): I-stat hCG, quantitative: <5 m[IU]/mL (ref <5)

## 2019-08-25 LAB — SALICYLATE LEVEL: Salicylate Lvl: <7 mg/dL (ref 2.8–30.0)

## 2019-08-25 LAB — ACETAMINOPHEN LEVEL: Acetaminophen (Tylenol), Serum: <10 ug/mL — ABNORMAL LOW (ref 10–30)

## 2019-08-25 LAB — ETHANOL: Alcohol, Ethyl (B): 20 mg/dL — ABNORMAL HIGH (ref <10)

## 2019-08-25 LAB — SARS CORONAVIRUS 2 BY RT PCR (HOSPITAL ORDER, PERFORMED IN ~~LOC~~ HOSPITAL LAB): SARS Coronavirus 2: NEGATIVE

## 2019-08-25 MED ORDER — CARIPRAZINE HCL 1.5 MG PO CAPS
1.5000 mg | ORAL_CAPSULE | Freq: Every day | ORAL | Status: DC
  Administered 2019-08-25: 13:00:00 1.5 mg via ORAL
  Filled 2019-08-25: qty 1

## 2019-08-25 MED ORDER — TRAZODONE HCL 100 MG PO TABS: 100.0000 mg | ORAL_TABLET | Freq: Every evening | ORAL | Status: DC | PRN

## 2019-08-25 MED ORDER — IBUPROFEN 200 MG PO TABS
600.0000 mg | ORAL_TABLET | Freq: Four times a day (QID) | ORAL | Status: DC | PRN
  Administered 2019-08-25: 12:00:00 600 mg via ORAL
  Filled 2019-08-25: qty 3

## 2019-08-25 MED ORDER — HYDROCODONE-ACETAMINOPHEN 5-325 MG PO TABS
1.0000 | ORAL_TABLET | Freq: Once | ORAL | Status: AC
  Administered 2019-08-25: 07:00:00 1 via ORAL
  Filled 2019-08-25: qty 1

## 2019-08-25 NOTE — ED Notes (Signed)
Spoke with Jania from [facility] who confirmed that the gravy on the lunch tray today does not have mushrooms or mushroom extract.

## 2019-08-25 NOTE — Progress Notes (Signed)
Patient ID: Hannah [name], female   DOB: [birth year], 28 y.o.   MRN: [id number]   Pt was seen and chart reviewed with treatment team. Pt will be restarted on Vraylar and observed for medication efficacy. Pt will be reassessed in the AM by psychiatry for possible discharge versus inpatient hospitalization.   Ethelene Hal, PMHNP-BC 08/25/2019        1119
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
## 2019-08-25 NOTE — Progress Notes (Signed)
Received Hannah [name] from the main ED with security and the nurse. Her personal items were checked and placed in the locker. She was oriented to her new environment.

## 2019-08-25 NOTE — ED Notes (Signed)
Report called to RN Opal Sidles, [place] [facility].  Pending transfer via [facility].
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
## 2019-08-25 NOTE — ED Notes (Signed)
Pt wanted a telephone out of her bag to get a telephone number that she needs. She said that there are 3 telephones underneath a large battery in her yellow overnight bag. This Probation officer got her telephone for her, but there were only two telephones in that bag.

## 2019-08-25 NOTE — ED Notes (Signed)
Pt's belongings (one white bag, one yellow backpack, and one red backpack) moved to the cabinet labeled, "patient belongings 9-12 [address]."
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
## 2019-08-25 NOTE — ED Notes (Signed)
[facility] transport requested. 

## 2019-08-25 NOTE — ED Notes (Signed)
Pt says that she has had cough, shortness of breath and diarrhea for 1 week. Also, she has missed a period and unsure if she is pregnant
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
## 2019-08-26 ENCOUNTER — Observation Stay (HOSPITAL_COMMUNITY)
Admission: AD | Admit: 2019-08-26 | Discharge: 2019-08-26 | Disposition: A | Discharge status: Home / Self Care | Payer: Medicaid Other | Source: Intra-hospital | Attending: Psychiatry | Admitting: Psychiatry

## 2019-08-26 ENCOUNTER — Encounter (HOSPITAL_COMMUNITY): Payer: Self-pay | Admitting: Emergency Medicine

## 2019-08-26 DIAGNOSIS — Z79899 Other long term (current) drug therapy: Secondary | ICD-10-CM | POA: Insufficient documentation

## 2019-08-26 DIAGNOSIS — Z6838 Body mass index (BMI) 38.0-38.9, adult: Secondary | ICD-10-CM | POA: Diagnosis not present

## 2019-08-26 DIAGNOSIS — F4310 Post-traumatic stress disorder, unspecified: Secondary | ICD-10-CM | POA: Diagnosis not present

## 2019-08-26 DIAGNOSIS — E669 Obesity, unspecified: Secondary | ICD-10-CM | POA: Diagnosis not present

## 2019-08-26 DIAGNOSIS — Z20828 Contact with and (suspected) exposure to other viral communicable diseases: Secondary | ICD-10-CM | POA: Diagnosis not present

## 2019-08-26 DIAGNOSIS — F3160 Bipolar disorder, current episode mixed, unspecified: Principal | ICD-10-CM | POA: Diagnosis present

## 2019-08-26 DIAGNOSIS — F603 Borderline personality disorder: Secondary | ICD-10-CM | POA: Insufficient documentation

## 2019-08-26 DIAGNOSIS — F909 Attention-deficit hyperactivity disorder, unspecified type: Secondary | ICD-10-CM | POA: Diagnosis not present

## 2019-08-26 DIAGNOSIS — Z590 Homelessness: Secondary | ICD-10-CM | POA: Diagnosis not present

## 2019-08-26 DIAGNOSIS — G4700 Insomnia, unspecified: Secondary | ICD-10-CM | POA: Diagnosis not present

## 2019-08-26 DIAGNOSIS — F319 Bipolar disorder, unspecified: Secondary | ICD-10-CM | POA: Diagnosis present

## 2019-08-26 MED ORDER — MAGNESIUM HYDROXIDE 400 MG/5ML PO SUSP: 30.0000 mL | Freq: Every day | ORAL | Status: DC | PRN

## 2019-08-26 MED ORDER — CARIPRAZINE HCL 1.5 MG PO CAPS: 1.5000 mg | ORAL_CAPSULE | Freq: Every day | ORAL | 0 refills | Status: DC

## 2019-08-26 MED ORDER — ACETAMINOPHEN 325 MG PO TABS: 650.0000 mg | ORAL_TABLET | Freq: Four times a day (QID) | ORAL | Status: DC | PRN

## 2019-08-26 MED ORDER — CARIPRAZINE HCL 1.5 MG PO CAPS: 1.5000 mg | ORAL_CAPSULE | Freq: Every day | ORAL | 0 refills | Status: AC

## 2019-08-26 MED ORDER — IBUPROFEN 600 MG PO TABS: 600.0000 mg | ORAL_TABLET | Freq: Four times a day (QID) | ORAL | Status: DC | PRN

## 2019-08-26 MED ORDER — ALUM & MAG HYDROXIDE-SIMETH 200-200-20 MG/5ML PO SUSP: 30.0000 mL | ORAL | Status: DC | PRN

## 2019-08-26 MED ORDER — TRAZODONE HCL 100 MG PO TABS: 100.0000 mg | ORAL_TABLET | Freq: Every evening | ORAL | Status: DC | PRN

## 2019-08-26 MED ORDER — CARIPRAZINE HCL 1.5 MG PO CAPS
1.5000 mg | ORAL_CAPSULE | Freq: Every day | ORAL | Status: DC
  Administered 2019-08-26: 1.5 mg via ORAL
  Filled 2019-08-26 (×3): qty 1

## 2019-08-26 NOTE — Progress Notes (Signed)
D: Pt denies SI/HI/AV  hallucinations. Pt is pleasant and cooperative. A: Pt was offered support and encouragement. Pt was given scheduled medications.  Q 15 minute checks were done for safety.  R:Pt  interacts well with staff. Pt is taking medication. Pt has no complaints.Pt receptive to treatment and safety maintained on unit.

## 2019-08-26 NOTE — Plan of Care (Signed)
[place] Observation Crisis Plan  Reason for Crisis Plan:  Crisis Stabilization   Plan of Care:  Referral for IOP  Family Support:      Current Living Environment:     Insurance:   Hospital Account    Name Acct ID Class Status Primary Coverage   Hannah [name], Hannah [name] [id number] Hannah [name]        Guarantor Account (for Hospital Account [id number])    Name Relation to Pt Service Area Active? Acct Type   Hannah [name] Self [facility] Yes Behavioral Health   Address Phone       [address] [place] [address] [phone])          Coverage Information (for Hospital Account [id number])    F/O Payor/Plan Precert #   MEDICAID [place]/MEDICAID [place] ACCESS    Subscriber Subscriber #   Hannah [name], Hannah [name] [id number]   Address Phone   [address] [place], [place] [address] [phone]      Legal Guardian:     Primary Care Provider:  Patient, [name]  Current Outpatient Providers:  [facility]  Psychiatrist:     Counselor/Therapist:     Compliant with Medications:  No  Additional Information:   Hannah [name] 9/13/20202:37 AM
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
## 2019-08-26 NOTE — Progress Notes (Addendum)
Nurse Discharge Note:  D:Patient alert, oriented and ambulatory at discharge. Patient denies pain.Patient denies SI/HI A/V hallucinations at this time. Pt appears calm and cooperative, and no distress noted. AVS/Follow-Up/Prescriptions reviewed with patient and written copies given to patient and she verbalized understanding. Prescription for Vraylar given to patient.   A: All Personal items in locker returned to pt.and she verified receipt. Pt escorted out of the building. Patient seen by social work prior to discharge and given housing resource information.  R:  Pt States she will comply with outpatient services, and take Medications as prescribed. Patient voiced no concerns at discharge.
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
## 2019-10-29 ENCOUNTER — Encounter (HOSPITAL_COMMUNITY): Payer: Self-pay | Admitting: Emergency Medicine

## 2019-10-29 ENCOUNTER — Emergency Department (HOSPITAL_COMMUNITY): Payer: Medicaid Other

## 2019-10-29 ENCOUNTER — Emergency Department (HOSPITAL_COMMUNITY)
Admission: EM | Admit: 2019-10-29 | Discharge: 2019-10-29 | Disposition: A | Discharge status: Home / Self Care | Payer: Medicaid Other | Attending: Emergency Medicine | Admitting: Emergency Medicine

## 2019-10-29 ENCOUNTER — Other Ambulatory Visit: Payer: Self-pay

## 2019-10-29 DIAGNOSIS — N739 Female pelvic inflammatory disease, unspecified: Secondary | ICD-10-CM

## 2019-10-29 DIAGNOSIS — Z91040 Latex allergy status: Secondary | ICD-10-CM | POA: Insufficient documentation

## 2019-10-29 DIAGNOSIS — Z79899 Other long term (current) drug therapy: Secondary | ICD-10-CM | POA: Insufficient documentation

## 2019-10-29 DIAGNOSIS — N83201 Unspecified ovarian cyst, right side: Secondary | ICD-10-CM | POA: Diagnosis not present

## 2019-10-29 DIAGNOSIS — R1030 Lower abdominal pain, unspecified: Secondary | ICD-10-CM | POA: Diagnosis present

## 2019-10-29 LAB — URINALYSIS, ROUTINE W REFLEX MICROSCOPIC
Bacteria, UA: NONE SEEN
Bilirubin Urine: NEGATIVE
Glucose, UA: NEGATIVE mg/dL
Ketones, ur: NEGATIVE mg/dL
Leukocytes,Ua: NEGATIVE
Nitrite: NEGATIVE
Protein, ur: NEGATIVE mg/dL
Specific Gravity, Urine: 1.029 (ref 1.005–1.030)
pH: 5 (ref 5.0–8.0)

## 2019-10-29 LAB — LIPASE, BLOOD: Lipase: 30 U/L (ref 11–51)

## 2019-10-29 LAB — COMPREHENSIVE METABOLIC PANEL
ALT: 18 U/L (ref 0–44)
AST: 15 U/L (ref 15–41)
Albumin: 3.6 g/dL (ref 3.5–5.0)
Alkaline Phosphatase: 86 U/L (ref 38–126)
Anion gap: 10 (ref 5–15)
BUN: 11 mg/dL (ref 6–20)
CO2: 24 mmol/L (ref 22–32)
Calcium: 9.4 mg/dL (ref 8.9–10.3)
Chloride: 106 mmol/L (ref 98–111)
Creatinine, Ser: 0.79 mg/dL (ref 0.44–1.00)
GFR calc Af Amer: >60 mL/min (ref >60)
GFR calc non Af Amer: >60 mL/min (ref >60)
Glucose, Bld: 115 mg/dL — ABNORMAL HIGH (ref 70–99)
Potassium: 4.5 mmol/L (ref 3.5–5.1)
Sodium: 140 mmol/L (ref 135–145)
Total Bilirubin: 0.3 mg/dL (ref 0.3–1.2)
Total Protein: 6.9 g/dL (ref 6.5–8.1)

## 2019-10-29 LAB — CBC
HCT: 44.5 % (ref 36.0–46.0)
Hemoglobin: 14 g/dL (ref 12.0–15.0)
MCH: 27.2 pg (ref 26.0–34.0)
MCHC: 31.5 g/dL (ref 30.0–36.0)
MCV: 86.4 fL (ref 80.0–100.0)
Platelets: 330 10*3/uL (ref 150–400)
RBC: 5.15 MIL/uL — ABNORMAL HIGH (ref 3.87–5.11)
RDW: 14 % (ref 11.5–15.5)
WBC: 7.2 10*3/uL (ref 4.0–10.5)
nRBC: 0 % (ref 0.0–0.2)

## 2019-10-29 LAB — WET PREP, GENITAL
Clue Cells Wet Prep HPF POC: NONE SEEN
Sperm: NONE SEEN
Trich, Wet Prep: NONE SEEN
Yeast Wet Prep HPF POC: NONE SEEN

## 2019-10-29 LAB — I-STAT BETA HCG BLOOD, ED (MC, WL, AP ONLY): I-stat hCG, quantitative: <5 m[IU]/mL (ref <5)

## 2019-10-29 MED ORDER — AZITHROMYCIN 250 MG PO TABS
1000.0000 mg | ORAL_TABLET | Freq: Once | ORAL | Status: AC
  Administered 2019-10-29: 17:00:00 1000 mg via ORAL
  Filled 2019-10-29: qty 4

## 2019-10-29 MED ORDER — DOXYCYCLINE HYCLATE 100 MG PO CAPS: 100.0000 mg | ORAL_CAPSULE | Freq: Two times a day (BID) | ORAL | 0 refills | Status: AC

## 2019-10-29 MED ORDER — METRONIDAZOLE 500 MG PO TABS
2000.0000 mg | ORAL_TABLET | Freq: Once | ORAL | Status: AC
  Administered 2019-10-29: 17:00:00 2000 mg via ORAL
  Filled 2019-10-29: qty 4

## 2019-10-29 MED ORDER — METRONIDAZOLE 500 MG PO TABS: 500.0000 mg | ORAL_TABLET | Freq: Two times a day (BID) | ORAL | 0 refills | Status: DC

## 2019-10-29 MED ORDER — IOHEXOL 300 MG/ML  SOLN
100.0000 mL | Freq: Once | INTRAMUSCULAR | Status: AC | PRN
  Administered 2019-10-29: 11:00:00 100 mL via INTRAVENOUS

## 2019-10-29 MED ORDER — SODIUM CHLORIDE 0.9 % IV SOLN
INTRAVENOUS | Status: DC
  Administered 2019-10-29: 10:00:00 via INTRAVENOUS

## 2019-10-29 MED ORDER — CEFTRIAXONE SODIUM 250 MG IJ SOLR
250.0000 mg | Freq: Once | INTRAMUSCULAR | Status: AC
  Administered 2019-10-29: 250 mg via INTRAMUSCULAR
  Filled 2019-10-29: qty 250

## 2019-10-29 MED ORDER — SODIUM CHLORIDE 0.9% FLUSH: 3.0000 mL | Freq: Once | INTRAVENOUS | Status: DC

## 2019-10-29 MED ORDER — STERILE WATER FOR INJECTION IJ SOLN
INTRAMUSCULAR | Status: AC
  Administered 2019-10-29: 10 mL
  Filled 2019-10-29: qty 10

## 2019-10-29 NOTE — ED Triage Notes (Signed)
Patient with urinary symptoms of burning with urination.  Patient states that she has had many of these.  Usually gets worse than better.   No fevers per patient, she is having abdominal pain with the burning.

## 2019-10-29 NOTE — ED Notes (Signed)
Patient verbalizes understanding of discharge instructions. Opportunity for questioning and answers were provided. Armband removed by staff, pt discharged from ED.  

## 2019-10-29 NOTE — Discharge Instructions (Signed)
Take the antibiotics as directed.  We gave the first doses here.  But she will need to continue the doxycycline and Flagyl at home.  The prescriptions were printed for you.  Recommend following up with health department also gave the information on the wellness clinic as a place to start for follow-up to.

## 2019-10-29 NOTE — ED Provider Notes (Signed)
[facility] EMERGENCY DEPARTMENT Provider Note   CSN: [id number] Arrival date & time: 10/29/19  0343     History   Chief Complaint Chief Complaint  Patient presents with  . Urinary Tract Infection    HPI Hannah [name] is a 28 y.o. female.     Patient presenting with a complaint of lower abdominal pain burning with urination.  States that she has been urinary tract infections in the past and she is pretty sure she has 1 again.  She also has some pelvic pain associated with it.  She talks about a brownish discharge.  Has been sexually active.  Patient currently homeless.  Pain does radiate to the back.  Denies any fevers or upper respiratory symptoms.     Past Medical History:  Diagnosis Date  . ADHD (attention deficit hyperactivity disorder)   . Bipolar 1 disorder ([place])   . Depression   . Heart murmur   . Insomnia   . Obesity     Patient Active Problem List   Diagnosis Date Noted  . Bipolar 1 disorder ([place]) 08/26/2019  . PTSD (post-traumatic stress disorder) 06/14/2013  . 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 DILATION AND CURETTAGE OF UTERUS    . FRACTURE SURGERY Right    arm from compound fracture     OB History   No obstetric history on file.      Home Medications    Prior to Admission medications   Medication Sig Start Date End Date Taking? Authorizing Provider  cariprazine (VRAYLAR) capsule Take 1 capsule (1.5 mg total) by mouth daily. 08/27/19  Yes Ethelene Hal, NP  gabapentin (NEURONTIN) 300 MG capsule Take 300 mg by mouth 2 (two) times daily.   Yes [provider]  traZODone (DESYREL) 100 MG tablet Take 100 mg by mouth at bedtime.   Yes [provider]  doxycycline (VIBRAMYCIN) 100 MG capsule Take 1 capsule (100 mg total) by mouth 2 (two) times daily for 14 days. 10/29/19 11/12/19  Fredia Sorrow, MD  metroNIDAZOLE (FLAGYL)  500 MG tablet Take 1 tablet (500 mg total) by mouth 2 (two) times daily. 10/29/19   Fredia Sorrow, MD    Family History No family history on file.  Social History Social History   Tobacco Use  . Smoking status: Never Smoker  . Smokeless tobacco: Never Used  Substance Use Topics  . Alcohol use: Yes    Comment: occ  . Drug use: No     Allergies   Demerol [meperidine], Mushroom extract complex, and Latex   Review of Systems Review of Systems  Constitutional: Negative for chills and fever.  HENT: Negative for congestion, rhinorrhea and sore throat.   Eyes: Negative for visual disturbance.  Respiratory: Negative for cough and shortness of breath.   Cardiovascular: Negative for chest pain and leg swelling.  Gastrointestinal: Positive for abdominal pain. Negative for diarrhea, nausea and vomiting.  Genitourinary: Positive for difficulty urinating, dysuria and vaginal discharge. Negative for genital sores and hematuria.  Musculoskeletal: Positive for back pain. Negative for neck pain.  Skin: Negative for rash.  Neurological: Negative for dizziness, light-headedness and headaches.  Hematological: Does not bruise/bleed easily.  Psychiatric/Behavioral: Negative for confusion.     Physical Exam Updated Vital Signs BP (!) 101/59   Pulse 68   Temp 98 F (36.7 C) (Oral)   Resp 17   LMP 10/18/2019 (Approximate)   SpO2  100%   Physical Exam Vitals signs and nursing note reviewed.  Constitutional:      General: She is not in acute distress.    Appearance: Normal appearance. She is well-developed.  HENT:     Head: Normocephalic and atraumatic.  Eyes:     Conjunctiva/sclera: Conjunctivae normal.     Pupils: Pupils are equal, round, and reactive to light.  Neck:     Musculoskeletal: Normal range of motion and neck supple.  Cardiovascular:     Rate and Rhythm: Normal rate and regular rhythm.     Heart sounds: No murmur.  Pulmonary:     Effort: Pulmonary effort is normal.  No respiratory distress.     Breath sounds: Normal breath sounds.  Abdominal:     General: Bowel sounds are normal.     Palpations: Abdomen is soft.     Tenderness: There is no abdominal tenderness.  Genitourinary:    General: Normal vulva.     Vagina: Vaginal discharge present.     Comments: External genitalia without any lesions.  Vaginal vault with sort of a yellow-brownish discharge.  Cervical motion tenderness uterine tenderness adnexal tenderness.  Fullness in the right adnexa area. Musculoskeletal: Normal range of motion.  Skin:    General: Skin is warm and dry.  Neurological:     General: No focal deficit present.     Mental Status: She is alert and oriented to person, place, and time.     Cranial Nerves: No cranial nerve deficit.     Sensory: No sensory deficit.     Motor: No weakness.      ED Treatments / Results  Labs (all labs ordered are listed, but only abnormal results are displayed) Labs Reviewed  WET PREP, GENITAL - Abnormal; Notable for the following components:      Result Value   WBC, Wet Prep HPF POC MANY (*)    All other components within normal limits  COMPREHENSIVE METABOLIC PANEL - Abnormal; Notable for the following components:   Glucose, Bld 115 (*)    All other components within normal limits  CBC - Abnormal; Notable for the following components:   RBC 5.15 (*)    All other components within normal limits  URINALYSIS, ROUTINE W REFLEX MICROSCOPIC - Abnormal; Notable for the following components:   APPearance HAZY (*)    Hgb urine dipstick MODERATE (*)    All other components within normal limits  URINE CULTURE  LIPASE, BLOOD  RPR  HIV ANTIBODY (ROUTINE TESTING W REFLEX)  I-STAT BETA HCG BLOOD, ED ([facility], [facility], [facility] ONLY)  GC/CHLAMYDIA PROBE AMP ([place]) NOT AT [facility]    EKG None  Radiology Ct Abdomen Pelvis W Contrast  Result Date: 10/29/2019 CLINICAL DATA:  Patient with urinary symptoms of burning with urination. Patient states that she  has had many of these. Pt is having pain in her abdomen as well. ^12700mL OMNIPAQUE IOHEXOL 300 MG/ML SOLNAbd pain, diverticulitis suspected EXAM: CT ABDOMEN AND PELVIS WITH CONTRAST TECHNIQUE: Multidetector CT imaging of the abdomen and pelvis was performed using the standard protocol following bolus administration of intravenous contrast. CONTRAST:  100mL OMNIPAQUE IOHEXOL 300 MG/ML  SOLN COMPARISON:  CT abdomen 12/16/2018 FINDINGS: Lower chest: Lung bases are clear. Hepatobiliary: No focal hepatic lesion. No biliary duct dilatation. Gallbladder is normal. Common bile duct is normal. Pancreas: Pancreas is normal. No ductal dilatation. No pancreatic inflammation. Spleen: Normal spleen Adrenals/urinary tract: Adrenal glands and kidneys are normal. The ureters and bladder normal. Stomach/Bowel: Stomach,  small bowel, appendix, and cecum are normal. The colon and rectosigmoid colon are normal. Vascular/Lymphatic: Abdominal aorta is normal caliber. No periportal or retroperitoneal adenopathy. No pelvic adenopathy. Reproductive: Uterus normal. Simple fluid attenuation 3 cm RIGHT ovarian cyst. Normal ovaries. Other: No free fluid. Musculoskeletal: No aggressive osseous lesion. IMPRESSION: 1. No acute findings in the abdomen pelvis. 2. Normal appendix. 3. No ureterolithiasis or obstructive uropathy. 4. Benign functional RIGHT ovarian cyst. Electronically Signed   By: Genevive Bi M.D.   On: 10/29/2019 11:01    Procedures Procedures (including critical care time)  Medications Ordered in ED Medications  sodium chloride flush (NS) 0.9 % injection 3 mL (3 mLs Intravenous Not Given 10/29/19 0958)  0.9 %  sodium chloride infusion ( Intravenous New Bag/Given 10/29/19 0958)  cefTRIAXone (ROCEPHIN) injection 250 mg (has no administration in time range)  azithromycin (ZITHROMAX) tablet 1,000 mg (has no administration in time range)  metroNIDAZOLE (FLAGYL) tablet 2,000 mg (has no administration in time range)  iohexol  (OMNIPAQUE) 300 MG/ML solution 100 mL (100 mLs Intravenous Contrast Given 10/29/19 1049)     Initial Impression / Assessment and Plan / ED Course  I have reviewed the triage vital signs and the nursing notes.  Pertinent labs & imaging results that were available during my care of the patient were reviewed by me and considered in my medical decision making (see chart for details).        Pregnancy test negative urinalysis not consistent with urinary tract infection.  CT scan was done of the abdomen showed evidence of a right ovarian cyst.  When on did pelvic patient has uterine tenderness adnexal tenderness and cervical motion tenderness suggestive of pelvic inflammatory disease.  Antibiotics were ordered prior to the wet prep being back.  Wet prep showed no evidence of any trichomonas.  However patient had already received Rocephin Zithromax and Flagyl.  For outpatient treatment of the PID will continue doxycycline and Flagyl for the next 14 days.  Patient given referral to the health department.  As well as the wellness clinic for follow-up.  CT scan without any other acute findings in the abdomen.  Final Clinical Impressions(s) / ED Diagnoses   Final diagnoses:  Pelvic inflammatory disease (PID)  Cyst of right ovary    ED Discharge Orders         Ordered    doxycycline (VIBRAMYCIN) 100 MG capsule  2 times daily     10/29/19 1613    metroNIDAZOLE (FLAGYL) 500 MG tablet  2 times daily,   Status:  Discontinued     10/29/19 1613    metroNIDAZOLE (FLAGYL) 500 MG tablet  2 times daily     10/29/19 1614           Vanetta Mulders, MD 10/29/19 1620

## 2019-10-30 LAB — URINE CULTURE

## 2019-10-30 LAB — RPR: RPR Ser Ql: NONREACTIVE

## 2019-10-30 LAB — HIV ANTIBODY (ROUTINE TESTING W REFLEX): HIV Screen 4th Generation wRfx: NONREACTIVE — AB

## 2019-10-31 LAB — GC/CHLAMYDIA PROBE AMP (~~LOC~~) NOT AT ARMC
Chlamydia: NEGATIVE
Neisseria Gonorrhea: NEGATIVE

## 2019-11-20 ENCOUNTER — Encounter: Payer: Self-pay | Admitting: Family Medicine

## 2019-11-20 ENCOUNTER — Ambulatory Visit: Payer: Medicaid Other | Attending: Family Medicine | Admitting: Family Medicine

## 2019-11-20 DIAGNOSIS — N83201 Unspecified ovarian cyst, right side: Secondary | ICD-10-CM

## 2019-11-20 DIAGNOSIS — F319 Bipolar disorder, unspecified: Secondary | ICD-10-CM | POA: Diagnosis not present

## 2019-11-20 DIAGNOSIS — N939 Abnormal uterine and vaginal bleeding, unspecified: Secondary | ICD-10-CM

## 2019-11-20 NOTE — Progress Notes (Signed)
Virtual Visit via Telephone Note  I connected with Hannah [name], on 11/20/2019 at 3:07 PM by telephone due to the COVID-19 pandemic and verified that I am speaking with the correct person using two identifiers.   Consent: I discussed the limitations, risks, security and privacy concerns of performing an evaluation and management service by telephone and the availability of in person appointments. I also discussed with the patient that there may be a patient responsible charge related to this service. The patient expressed understanding and agreed to proceed.   Location of Patient: Home  Location of Provider: Clinic   Persons participating in Telemedicine visit: Hannah [name] Maryclare Bean- Fiance Dr. Margarita Rana     History of Present Illness: 28 year old female with a history of bipolar disorder who presents today to establish care.  Seen at the ED on 10/29/2023 for pelvic inflammatory disease, CT abdomen and pelvis revealed right ovarian cyst.  She was treated with IV antibiotics in the ED and subsequently discharged with doxycycline and Flagyl for 14 days.   She has completed her course of antibiotics and complains that her pelvic pain is sharp and same like what she had when she presented to the ED. Bending over is worse with associated bleeding which has been ongoing for 3 weeks. When she was 16 she had a similar experience. She also has nausea and dizziness.  CBC from ED visit was negative for anemia. She states has a family history of ovarian cancer and would like her ovarian cyst checked out by GYN.  Her Bipolar disorder was managed by psych in [place] however she no longer has a psychiatrist to manage her bipolar disorder.  Past Medical History:  Diagnosis Date  . ADHD (attention deficit hyperactivity disorder)   . Bipolar 1 disorder ([place])   . Depression   . Heart murmur   . Insomnia   . Obesity    Allergies  Allergen Reactions  . Demerol  [Meperidine] Hives  . Mushroom Extract Complex Swelling  . Latex Rash    Current Outpatient Medications on File Prior to Visit  Medication Sig Dispense Refill  . cariprazine (VRAYLAR) capsule Take 1 capsule (1.5 mg total) by mouth daily. 30 capsule 0  . gabapentin (NEURONTIN) 300 MG capsule Take 300 mg by mouth 2 (two) times daily.    . metroNIDAZOLE (FLAGYL) 500 MG tablet Take 1 tablet (500 mg total) by mouth 2 (two) times daily. (Patient not taking: Reported on 11/20/2019) 14 tablet 0  . traZODone (DESYREL) 100 MG tablet Take 100 mg by mouth at bedtime.     No current facility-administered medications on file prior to visit.     Observations/Objective: Awake, alert, oriented x3 Not in acute distress  Assessment and Plan: 1. Abnormal uterine bleeding Might benefit from Provera - Ambulatory referral to Gynecology  2. Cyst of right ovary She is eager to have this evaluated given family history of ovarian cancer - Ambulatory referral to Gynecology  3. Bipolar 1 disorder ([place]) - Ambulatory referral to Psychiatry   Follow Up Instructions: Return if symptoms worsen or fail to improve.    I discussed the assessment and treatment plan with the patient. The patient was provided an opportunity to ask questions and all were answered. The patient agreed with the plan and demonstrated an understanding of the instructions.   The patient was advised to call back or seek an in-person evaluation if the symptoms worsen or if the condition fails to improve as anticipated.  I provided 16 minutes total of non-face-to-face time during this encounter including median intraservice time, reviewing previous notes, labs, imaging, medications, management and patient verbalized understanding.     Hoy Register, MD, FAAFP. [facility] [place], [place] [phone]   11/20/2019, 3:07 PM

## 2019-11-20 NOTE — Progress Notes (Signed)
Patient has been called and DOB has been verified. Patient has been screened and transferred to PCP to start phone visit.   Patient is having pain in her lower abdomen.

## 2019-12-26 ENCOUNTER — Encounter: Payer: Medicaid Other | Admitting: Obstetrics and Gynecology

## 2020-01-15 ENCOUNTER — Encounter: Payer: Medicaid Other | Admitting: Obstetrics and Gynecology

## 2020-01-16 ENCOUNTER — Encounter: Payer: Self-pay | Admitting: Obstetrics & Gynecology

## 2020-01-27 ENCOUNTER — Emergency Department (HOSPITAL_COMMUNITY)
Admission: EM | Admit: 2020-01-27 | Discharge: 2020-01-29 | Disposition: A | Discharge status: Other Acute Inpatient Hospital | Payer: Medicaid Other | Attending: Emergency Medicine | Admitting: Emergency Medicine

## 2020-01-27 ENCOUNTER — Other Ambulatory Visit: Payer: Self-pay

## 2020-01-27 ENCOUNTER — Encounter (HOSPITAL_COMMUNITY): Payer: Self-pay | Admitting: Emergency Medicine

## 2020-01-27 DIAGNOSIS — F32A Depression, unspecified: Secondary | ICD-10-CM

## 2020-01-27 DIAGNOSIS — R45851 Suicidal ideations: Secondary | ICD-10-CM | POA: Diagnosis not present

## 2020-01-27 DIAGNOSIS — F329 Major depressive disorder, single episode, unspecified: Secondary | ICD-10-CM | POA: Diagnosis present

## 2020-01-27 DIAGNOSIS — F909 Attention-deficit hyperactivity disorder, unspecified type: Secondary | ICD-10-CM | POA: Diagnosis not present

## 2020-01-27 DIAGNOSIS — Z79899 Other long term (current) drug therapy: Secondary | ICD-10-CM | POA: Insufficient documentation

## 2020-01-27 DIAGNOSIS — Z91040 Latex allergy status: Secondary | ICD-10-CM | POA: Diagnosis not present

## 2020-01-27 DIAGNOSIS — Z20822 Contact with and (suspected) exposure to covid-19: Secondary | ICD-10-CM | POA: Insufficient documentation

## 2020-01-27 LAB — RESPIRATORY PANEL BY RT PCR (FLU A&B, COVID)
Influenza A by PCR: NEGATIVE
Influenza B by PCR: NEGATIVE
SARS Coronavirus 2 by RT PCR: NEGATIVE

## 2020-01-27 LAB — COMPREHENSIVE METABOLIC PANEL
ALT: 18 U/L (ref 0–44)
AST: 17 U/L (ref 15–41)
Albumin: 3.9 g/dL (ref 3.5–5.0)
Alkaline Phosphatase: 83 U/L (ref 38–126)
Anion gap: 8 (ref 5–15)
BUN: 18 mg/dL (ref 6–20)
CO2: 25 mmol/L (ref 22–32)
Calcium: 9.3 mg/dL (ref 8.9–10.3)
Chloride: 109 mmol/L (ref 98–111)
Creatinine, Ser: 0.67 mg/dL (ref 0.44–1.00)
GFR calc Af Amer: >60 mL/min (ref >60)
GFR calc non Af Amer: >60 mL/min (ref >60)
Glucose, Bld: 65 mg/dL — ABNORMAL LOW (ref 70–99)
Potassium: 4.1 mmol/L (ref 3.5–5.1)
Sodium: 142 mmol/L (ref 135–145)
Total Bilirubin: 0.2 mg/dL — ABNORMAL LOW (ref 0.3–1.2)
Total Protein: 7 g/dL (ref 6.5–8.1)

## 2020-01-27 LAB — CBC
HCT: 42.7 % (ref 36.0–46.0)
Hemoglobin: 13.5 g/dL (ref 12.0–15.0)
MCH: 26.6 pg (ref 26.0–34.0)
MCHC: 31.6 g/dL (ref 30.0–36.0)
MCV: 84.2 fL (ref 80.0–100.0)
Platelets: 330 10*3/uL (ref 150–400)
RBC: 5.07 MIL/uL (ref 3.87–5.11)
RDW: 13.7 % (ref 11.5–15.5)
WBC: 7.1 10*3/uL (ref 4.0–10.5)
nRBC: 0 % (ref 0.0–0.2)

## 2020-01-27 LAB — ACETAMINOPHEN LEVEL: Acetaminophen (Tylenol), Serum: <10 ug/mL — ABNORMAL LOW (ref 10–30)

## 2020-01-27 LAB — CBG MONITORING, ED: Glucose-Capillary: 104 mg/dL — ABNORMAL HIGH (ref 70–99)

## 2020-01-27 LAB — SALICYLATE LEVEL: Salicylate Lvl: <7 mg/dL — ABNORMAL LOW (ref 7.0–30.0)

## 2020-01-27 LAB — ETHANOL: Alcohol, Ethyl (B): <10 mg/dL (ref <10)

## 2020-01-27 LAB — I-STAT BETA HCG BLOOD, ED (MC, WL, AP ONLY): I-stat hCG, quantitative: <5 m[IU]/mL (ref <5)

## 2020-01-27 NOTE — BH Assessment (Addendum)
Tele Assessment Note   Patient Name: Sharmeka Palmisano MRN: [id number] Referring Physician: Dr. Gwyneth Sprout Location of Patient: [facility] Location of Provider: Behavioral Health TTS Department  Sandre Kitty Kirsch is an 29 y.o. female presenting voluntarily to the ED for SI with plan to overdose. Patient reported onset of SI for 2 days. Patient reported manic behaviors of washing and re-washing clothes and re-cleaning items over and over again for past 2 days. Patient reported triggers/stressors are financial problems and lack of support. Also patient reported having Family Court on 01/30/2020, stating she is asking for more time for kids. Patient reported her 5 children were removed out of her custody last 02/2019, patient no longer has custody. Patient reported that she has been living in a hotel but could no longer pay her rent and today was the day she had to move out and she will be living back on the streets. Patient reported significant stress and that she has been on a roller coaster of emotion. Patient reported off medications for the past 2 months.  Patient reported she is not currently receiving any outpatient mental health services. Patient was last at [facility] for overnight observation 08/2019. Patient reported 9 suicide attempts during teenage years and 3x suicide attempts during adult years. Patient reports self-medicating with marijuana. Patient reported "randomly sleeping" and poor appetite, as patient reported she forgets to eat. Patient was calm and cooperative during assessment.  Collateral Contact: Patient refused stating she has no family support.  Diagnosis: Major depressive disorder  Past Medical History:  Past Medical History:  Diagnosis Date  . ADHD (attention deficit hyperactivity disorder)   . Bipolar 1 disorder (HCC)   . Depression   . Heart murmur   . Insomnia   . Obesity     Past Surgical History:  Procedure Laterality Date  . DILATION AND CURETTAGE OF UTERUS     . FRACTURE SURGERY Right    arm from compound fracture    Family History: No family history on file.  Social History:  reports that she has never smoked. She has never used smokeless tobacco. She reports current alcohol use. She reports that she does not use drugs.  Additional Social History:  Alcohol / Drug Use Pain Medications: see MAR Prescriptions: see MAR Over the Counter: see MAR  CIWA: CIWA-Ar BP: (!) 108/56 Pulse Rate: (!) 101 COWS:    Allergies:  Allergies  Allergen Reactions  . Demerol [Meperidine] Hives  . Mushroom Extract Complex Swelling  . Latex Rash    Home Medications: (Not in a hospital admission)   OB/GYN Status:  Patient's last menstrual period was 12/27/2019.  General Assessment Data Location of Assessment: [facility] ED TTS Assessment: In system Is this a Tele or Face-to-Face Assessment?: Tele Assessment Is this an Initial Assessment or a Re-assessment for this encounter?: Initial Assessment Patient Accompanied by:: N/A Language Other than English: No Living Arrangements: Other (Comment)(home with fiance) What gender do you identify as?: Female Marital status: Single Pregnancy Status: Unknown Living Arrangements: Other (Comment)(lives with fiance) Can pt return to current living arrangement?: Yes Admission Status: Voluntary Is patient capable of signing voluntary admission?: Yes Referral Source: Self/Family/Friend     Crisis Care Plan Living Arrangements: Other (Comment)(lives with fiance) Legal Guardian: Other:(self) Name of Psychiatrist: (none) Name of Therapist: (none)  Education Status Is patient currently in school?: No Is the patient employed, unemployed or receiving disability?: Unemployed  Risk to self with the past 6 months Suicidal Ideation: Yes-Currently Present Has patient been  a risk to self within the past 6 months prior to admission? : Yes Suicidal Intent: Yes-Currently Present Has patient had any suicidal intent within the  past 6 months prior to admission? : Yes Is patient at risk for suicide?: Yes Suicidal Plan?: Yes-Currently Present Has patient had any suicidal plan within the past 6 months prior to admission? : Yes Specify Current Suicidal Plan: (SI with plan to overdose) Access to Means: Yes Specify Access to Suicidal Means: (overdose on medications) What has been your use of drugs/alcohol within the last 12 months?: (marijuana) Previous Attempts/Gestures: Yes How many times?: (9x teenage and 3x adult) Other Self Harm Risks: (none reported) Triggers for Past Attempts: Unknown Intentional Self Injurious Behavior: None Family Suicide History: No Recent stressful life event(s): Financial Problems Persecutory voices/beliefs?: No Depression: Yes Depression Symptoms: Tearfulness, Insomnia, Isolating, Fatigue, Guilt, Loss of interest in usual pleasures, Feeling worthless/self pity Substance abuse history and/or treatment for substance abuse?: No Suicide prevention information given to non-admitted patients: Not applicable  Risk to Others within the past 6 months Homicidal Ideation: No Does patient have any lifetime risk of violence toward others beyond the six months prior to admission? : No Thoughts of Harm to Others: No Current Homicidal Intent: No Current Homicidal Plan: No Access to Homicidal Means: No History of harm to others?: No Assessment of Violence: None Noted Violent Behavior Description: (none reported) Does patient have access to weapons?: No Criminal Charges Pending?: No Does patient have a court date: Yes Court Date: (01/30/20 Family Court) Is patient on probation?: No  Psychosis Hallucinations: None noted Delusions: None noted  Mental Status Report Appearance/Hygiene: Unremarkable Eye Contact: Fair Motor Activity: Freedom of movement Speech: Logical/coherent Level of Consciousness: Alert Mood: Depressed Affect: Depressed, Appropriate to circumstance Anxiety Level:  Minimal Thought Processes: Coherent, Relevant Judgement: Partial Orientation: Person, Place, Time, Situation Obsessive Compulsive Thoughts/Behaviors: None  Cognitive Functioning Concentration: Good Memory: Recent Intact Is patient IDD: No Insight: Poor Impulse Control: Poor Appetite: Poor Have you had any weight changes? : No Change Sleep: Decreased Total Hours of Sleep: (sporadic) Vegetative Symptoms: Staying in bed  ADLScreening [facility] Assessment Services) Patient's cognitive ability adequate to safely complete daily activities?: Yes Patient able to express need for assistance with ADLs?: Yes Independently performs ADLs?: Yes (appropriate for developmental age)  Prior Inpatient Therapy Prior Inpatient Therapy: Yes Prior Therapy Dates: (08/2019 observation [facility] Unit) Prior Therapy Facilty/Provider(s): ([facility]) Reason for Treatment: (mental illness)  Prior Outpatient Therapy Prior Outpatient Therapy: No Does patient have an ACCT team?: No Does patient have Intensive In-House Services?  : No Does patient have [facility] services? : No Does patient have P4CC services?: No  ADL Screening (condition at time of admission) Patient's cognitive ability adequate to safely complete daily activities?: Yes Patient able to express need for assistance with ADLs?: Yes Independently performs ADLs?: Yes (appropriate for developmental age)  Regulatory affairs officer (For Healthcare) Does Patient Have a Medical Advance Directive?: No Would patient like information on creating a medical advance directive?: No - Patient declined   Disposition:  Disposition Initial Assessment Completed for this Encounter: Yes  Lindon Romp, NP, patient meets inpatient criteria. AC, no appropriate beds. TTS to secure placement.    This service was provided via telemedicine using a 2-way, interactive audio and video technology.  Names of all persons participating in this telemedicine service and their role in this  encounter. Name: Teofilo Pod Role: Patient  Name: Kirtland Bouchard Role: TTS Clinician  Name:  Role:   Name:  Role:     Burnetta Sabin 01/27/2020 8:22 PM

## 2020-01-27 NOTE — ED Notes (Signed)
TTS consult started at 1950 hours

## 2020-01-27 NOTE — ED Triage Notes (Signed)
Pt states she has been off her BH meds for 2 months.  Reports suicidal ideation with a plan to overdose.

## 2020-01-27 NOTE — ED Notes (Addendum)
ALL belongings inventoried - 3 labeled bags placed in [facility] [address] - Valuables Envelope - w/Security. Pt voiced understanding of Medical Clearance Pt Policy. Pt noted to be wearing burgundy scrubs and socks over hands - states d/t "they are cold". Pt aware of need for urine specimen. Urine cup given.

## 2020-01-27 NOTE — ED Notes (Signed)
Pt provided with turkey sandwich and ginger ale at this time.

## 2020-01-27 NOTE — ED Provider Notes (Signed)
[facility] EMERGENCY DEPARTMENT Provider Note   CSN: [id number] Arrival date & time: 01/27/20  1601     History Chief Complaint  Patient presents with  . Suicidal    Hannah [name] is a 29 y.o. female.  Patient is a 29 year old female with a history of bipolar disease, depression, PCOS who is presenting today with complaints of rapid mood swings, intermittent suicidal thoughts and excessive stress.  Patient states that she has been living in a hotel but she recently could no longer pay her rent and today was the day she had to move out and she will be living back on the streets.  This is caused significant stress and she states for the last few weeks she has been on a roller coaster of emotion.  She has been going in between mania and depression.  Sometimes she has a ton of energy does not need to sleep and is hyperactive and then other days she is unable to even get out of bed.  Patient has been off medications for approximately 2 months because she states she was unable to get them filled.  Her suicidal thoughts are of overdosing but she is not actively trying to hurt herself.  She does use marijuana regularly and states it helps her to stay calm but denies any other drug or alcohol use.  She denies any medical problems today such as chest pain, cough, fever, shortness of breath, abdominal pain.  Menses is a few days late but she states she has Nexplanon and it is not unusual for her to have irregular periods.  The history is provided by the patient.       Past Medical History:  Diagnosis Date  . ADHD (attention deficit hyperactivity disorder)   . Bipolar 1 disorder (HCC)   . Depression   . Heart murmur   . Insomnia   . Obesity     Patient Active Problem List   Diagnosis Date Noted  . Bipolar 1 disorder (HCC) 08/26/2019  . PTSD (post-traumatic stress disorder) 06/14/2013  . 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 DILATION AND CURETTAGE OF UTERUS    . FRACTURE SURGERY Right    arm from compound fracture     OB History   No obstetric history on file.     No family history on file.  Social History   Tobacco Use  . Smoking status: Never Smoker  . Smokeless tobacco: Never Used  Substance Use Topics  . Alcohol use: Yes    Comment: occ  . Drug use: No    Home Medications Prior to Admission medications   Medication Sig Start Date End Date Taking? Authorizing Provider  cariprazine (VRAYLAR) capsule Take 1 capsule (1.5 mg total) by mouth daily. 08/27/19   Laveda Abbe, NP  gabapentin (NEURONTIN) 300 MG capsule Take 300 mg by mouth 2 (two) times daily.    [provider]  metroNIDAZOLE (FLAGYL) 500 MG tablet Take 1 tablet (500 mg total) by mouth 2 (two) times daily. Patient not taking: Reported on 11/20/2019 10/29/19   Vanetta Mulders, MD  traZODone (DESYREL) 100 MG tablet Take 100 mg by mouth at bedtime.    [provider]    Allergies    Demerol [meperidine], Mushroom extract complex, and Latex  Review of Systems   Review of Systems  All other systems reviewed and are negative.   Physical Exam  Updated Vital Signs BP 128/62 (BP Location: Left Arm)   Pulse (!) 117   Temp 99 F (37.2 C) (Oral)   Resp 16   LMP 12/27/2019   SpO2 100%   Physical Exam Vitals and nursing note reviewed.  Constitutional:      General: She is not in acute distress.    Appearance: Normal appearance. She is well-developed and normal weight.  HENT:     Head: Normocephalic and atraumatic.  Eyes:     Pupils: Pupils are equal, round, and reactive to light.  Cardiovascular:     Rate and Rhythm: Regular rhythm. Tachycardia present.     Heart sounds: Normal heart sounds. No murmur. No friction rub.  Pulmonary:     Effort: Pulmonary effort is normal.     Breath sounds: Normal breath sounds. No wheezing or rales.  Abdominal:     General: Bowel  sounds are normal. There is no distension.     Palpations: Abdomen is soft.     Tenderness: There is no abdominal tenderness. There is no guarding or rebound.  Musculoskeletal:        General: No tenderness. Normal range of motion.     Comments: No edema  Skin:    General: Skin is warm and dry.     Findings: No rash.  Neurological:     General: No focal deficit present.     Mental Status: She is alert and oriented to person, place, and time. Mental status is at baseline.     Cranial Nerves: No cranial nerve deficit.  Psychiatric:        Mood and Affect: Mood normal.        Behavior: Behavior is cooperative.        Thought Content: Thought content includes suicidal ideation.     ED Results / Procedures / Treatments   Labs (all labs ordered are listed, but only abnormal results are displayed) Labs Reviewed  COMPREHENSIVE METABOLIC PANEL - Abnormal; Notable for the following components:      Result Value   Glucose, Bld 65 (*)    Total Bilirubin 0.2 (*)    All other components within normal limits  SALICYLATE LEVEL - Abnormal; Notable for the following components:   Salicylate Lvl <2.6 (*)    All other components within normal limits  ACETAMINOPHEN LEVEL - Abnormal; Notable for the following components:   Acetaminophen (Tylenol), Serum <10 (*)    All other components within normal limits  CBG MONITORING, ED - Abnormal; Notable for the following components:   Glucose-Capillary 104 (*)    All other components within normal limits  RESPIRATORY PANEL BY RT PCR (FLU A&B, COVID)  ETHANOL  CBC  RAPID URINE DRUG SCREEN, HOSP PERFORMED  I-STAT BETA HCG BLOOD, ED ([facility], [facility], [facility] ONLY)    EKG None  Radiology No results found.  Procedures Procedures (including critical care time)  Medications Ordered in ED Medications - No data to display  ED Course  I have reviewed the triage vital signs and the nursing notes.  Pertinent labs & imaging results that were available during my  care of the patient were reviewed by me and considered in my medical decision making (see chart for details).    MDM Rules/Calculators/A&P                      Patient with a history of bipolar thoughts and she has been fluctuating between mania and depression with suicidal thoughts.  She  is thinking of overdosing.  She is not actively trying to hurt herself.  All of this has seemed to be exacerbated by increased stress from losing her hotel that she has been living in due to inability to pay.  She is currently off all medications.  She has no medical complaints at this time.  hCG is within normal limits.  No acute findings on exam.  Patient is otherwise medically clear.  Labs are pending.  TTS to evaluate.  11:05 PM Labs are within normal limits.  Patient meets inpt criteria and Covid is negative.  Final Clinical Impression(s) / ED Diagnoses Final diagnoses:  Suicidal ideation  Depression, unspecified depression type    Rx / DC Orders ED Discharge Orders    None       Gwyneth Sprout, MD 01/27/20 2306

## 2020-01-28 LAB — RAPID URINE DRUG SCREEN, HOSP PERFORMED
Amphetamines: NOT DETECTED
Barbiturates: NOT DETECTED
Benzodiazepines: NOT DETECTED
Cocaine: NOT DETECTED
Opiates: NOT DETECTED
Tetrahydrocannabinol: POSITIVE — AB

## 2020-01-28 NOTE — ED Provider Notes (Signed)
Reviewed patient chart.   Plan: SI, mania, meets inpatient criteria.   Meds: None.  Labs: Reviewed. CBG better. Pt eating.    Dispo: Awaiting placement.   BP 109/62 (BP Location: Right Arm)   Pulse 70   Temp (!) 97.5 F (36.4 C) (Oral)   Resp 16   LMP 12/27/2019   SpO2 100%      Renne Crigler, PA-C 01/28/20 9935    Rolan Bucco, MD 01/28/20 1233

## 2020-01-28 NOTE — ED Notes (Signed)
New urine specimen cup given to pt d/t states she was only able to provide small sample of urine last pm and threw cup away.

## 2020-01-28 NOTE — ED Notes (Signed)
Breakfast Ordered 

## 2020-01-28 NOTE — BH Assessment (Signed)
TTS Reassessment: Pt presents sitting upright in bed, dressed in scrubs. Pt advised her move to [facility] Obs is cancelled/delayed due to unit being closed this evening. Pt voiced understanding. She reports difficulty managing her anger throughout day today due to various things. Pt states she knows she needs inpt tx: needs to get back on meds and get mood under control. Pt reports that for her family court date on 01/30/20, she thinks as long as she is contact with her attorney she may be able to attend by phone or video. Pt reports ongoing SI, noting nothing available to harm self in room currently. She denies HI and AVH. Fransisca Kaufmann, NP recommends inpt psychiatric tx recommendation.

## 2020-01-28 NOTE — ED Notes (Signed)
Soda and graham crackers given as snack per Pt request

## 2020-01-28 NOTE — ED Notes (Signed)
Dinner tray arrived; pt eating meal.

## 2020-01-28 NOTE — ED Notes (Signed)
Per [facility], pt to be accepted to [facility] Obs. Pt aware.

## 2020-01-28 NOTE — ED Notes (Signed)
Called BH dispo. Was told that after the re-eval, inpatient placement is still recommended, and that her acceptance to obs was canceled due to unit being closed. Current plan to seek inpatient placement.

## 2020-01-28 NOTE — ED Notes (Signed)
TTS robot at bedside for [facility] re-eval. Cell phone placed at bedside along with robot per [facility] request.

## 2020-01-28 NOTE — BHH Counselor (Signed)
Per nursing, [facility] Obs Unit will not be operating this evening & pt's admission will have to be delayed or diverted elsewhere.

## 2020-01-28 NOTE — BH Assessment (Signed)
Per Tamara, patient accepted to [facility] (main campus) for admission 01/29/20 (after 8am). The accepting provider is Dr. Thomas Cornwall. The nurse report number is [phone].  

## 2020-01-29 NOTE — ED Notes (Signed)
ALL belongings - 3 labeled bags and 1 valuables envelope - Safe Transport - Pt aware.

## 2020-01-29 NOTE — ED Notes (Signed)
Pt voiced understanding and agreement w/tx plan - accepted to [facility].

## 2020-02-08 ENCOUNTER — Encounter (HOSPITAL_COMMUNITY): Payer: Self-pay

## 2020-02-08 ENCOUNTER — Other Ambulatory Visit: Payer: Self-pay

## 2020-02-08 ENCOUNTER — Emergency Department (HOSPITAL_COMMUNITY): Payer: Medicaid Other

## 2020-02-08 ENCOUNTER — Observation Stay (HOSPITAL_COMMUNITY)
Admission: EM | Admit: 2020-02-08 | Discharge: 2020-02-09 | Disposition: A | Discharge status: Home / Self Care | Payer: Medicaid Other | Attending: Psychiatry | Admitting: Psychiatry

## 2020-02-08 DIAGNOSIS — Z20822 Contact with and (suspected) exposure to covid-19: Secondary | ICD-10-CM | POA: Insufficient documentation

## 2020-02-08 DIAGNOSIS — F332 Major depressive disorder, recurrent severe without psychotic features: Secondary | ICD-10-CM | POA: Diagnosis present

## 2020-02-08 DIAGNOSIS — R45851 Suicidal ideations: Secondary | ICD-10-CM | POA: Diagnosis not present

## 2020-02-08 DIAGNOSIS — F909 Attention-deficit hyperactivity disorder, unspecified type: Secondary | ICD-10-CM | POA: Insufficient documentation

## 2020-02-08 DIAGNOSIS — R1032 Left lower quadrant pain: Secondary | ICD-10-CM | POA: Insufficient documentation

## 2020-02-08 DIAGNOSIS — F319 Bipolar disorder, unspecified: Secondary | ICD-10-CM | POA: Insufficient documentation

## 2020-02-08 DIAGNOSIS — Z6838 Body mass index (BMI) 38.0-38.9, adult: Secondary | ICD-10-CM | POA: Insufficient documentation

## 2020-02-08 DIAGNOSIS — E282 Polycystic ovarian syndrome: Secondary | ICD-10-CM | POA: Diagnosis not present

## 2020-02-08 DIAGNOSIS — F4325 Adjustment disorder with mixed disturbance of emotions and conduct: Secondary | ICD-10-CM | POA: Diagnosis not present

## 2020-02-08 DIAGNOSIS — F4310 Post-traumatic stress disorder, unspecified: Secondary | ICD-10-CM | POA: Insufficient documentation

## 2020-02-08 DIAGNOSIS — E669 Obesity, unspecified: Secondary | ICD-10-CM | POA: Insufficient documentation

## 2020-02-08 DIAGNOSIS — Z79899 Other long term (current) drug therapy: Secondary | ICD-10-CM | POA: Diagnosis not present

## 2020-02-08 HISTORY — DX: Polycystic ovarian syndrome: E28.2

## 2020-02-08 LAB — URINALYSIS, ROUTINE W REFLEX MICROSCOPIC
Bilirubin Urine: NEGATIVE
Glucose, UA: NEGATIVE mg/dL
Hgb urine dipstick: NEGATIVE
Ketones, ur: NEGATIVE mg/dL
Leukocytes,Ua: NEGATIVE
Nitrite: NEGATIVE
Protein, ur: NEGATIVE mg/dL
Specific Gravity, Urine: 1.021 (ref 1.005–1.030)
pH: 7 (ref 5.0–8.0)

## 2020-02-08 LAB — CBC
HCT: 39.6 % (ref 36.0–46.0)
Hemoglobin: 12.5 g/dL (ref 12.0–15.0)
MCH: 26.9 pg (ref 26.0–34.0)
MCHC: 31.6 g/dL (ref 30.0–36.0)
MCV: 85.2 fL (ref 80.0–100.0)
Platelets: 267 10*3/uL (ref 150–400)
RBC: 4.65 MIL/uL (ref 3.87–5.11)
RDW: 14.5 % (ref 11.5–15.5)
WBC: 4.8 10*3/uL (ref 4.0–10.5)
nRBC: 0 % (ref 0.0–0.2)

## 2020-02-08 LAB — COMPREHENSIVE METABOLIC PANEL
ALT: 32 U/L (ref 0–44)
AST: 20 U/L (ref 15–41)
Albumin: 3.7 g/dL (ref 3.5–5.0)
Alkaline Phosphatase: 76 U/L (ref 38–126)
Anion gap: 7 (ref 5–15)
BUN: 11 mg/dL (ref 6–20)
CO2: 26 mmol/L (ref 22–32)
Calcium: 8.9 mg/dL (ref 8.9–10.3)
Chloride: 106 mmol/L (ref 98–111)
Creatinine, Ser: 0.59 mg/dL (ref 0.44–1.00)
GFR calc Af Amer: >60 mL/min (ref >60)
GFR calc non Af Amer: >60 mL/min (ref >60)
Glucose, Bld: 123 mg/dL — ABNORMAL HIGH (ref 70–99)
Potassium: 3.9 mmol/L (ref 3.5–5.1)
Sodium: 139 mmol/L (ref 135–145)
Total Bilirubin: 0.3 mg/dL (ref 0.3–1.2)
Total Protein: 6.8 g/dL (ref 6.5–8.1)

## 2020-02-08 LAB — I-STAT BETA HCG BLOOD, ED (MC, WL, AP ONLY): I-stat hCG, quantitative: <5 m[IU]/mL (ref <5)

## 2020-02-08 LAB — LIPASE, BLOOD: Lipase: 25 U/L (ref 11–51)

## 2020-02-08 MED ORDER — SODIUM CHLORIDE 0.9% FLUSH
3.0000 mL | Freq: Once | INTRAVENOUS | Status: AC
  Administered 2020-02-08: 3 mL via INTRAVENOUS

## 2020-02-08 MED ORDER — NICOTINE 21 MG/24HR TD PT24: 21.0000 mg | MEDICATED_PATCH | Freq: Every day | TRANSDERMAL | Status: DC

## 2020-02-08 MED ORDER — ACETAMINOPHEN 325 MG PO TABS: 650.0000 mg | ORAL_TABLET | ORAL | Status: DC | PRN

## 2020-02-08 MED ORDER — ONDANSETRON HCL 4 MG PO TABS: 4.0000 mg | ORAL_TABLET | Freq: Three times a day (TID) | ORAL | Status: DC | PRN
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
## 2020-02-08 NOTE — ED Notes (Signed)
Pt complaining of abdominal pain and diarrhea x3 days and dysuria x 2 days. Pt denies nausea or vomiting. Pt denies fever. Pt denies hematuria or melena.

## 2020-02-08 NOTE — ED Notes (Signed)
TTS at bedside. 
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
## 2020-02-08 NOTE — ED Notes (Addendum)
Pt. In burgundy scrub. Pt. wanded by security. Pt. Has 1 belongings bag and 2 bookbags. Pt. Belongings locked up at the nurses station in cabinet 1-8 zone. Pt. Has 2 cell phones, 1 pr burgundy scrubs,(from another facility in [facility]), 1 black jacket, 1 pr black shoes and 1 pr. Black socks. Pt. Not IVCd. No sitter at bedside.

## 2020-02-08 NOTE — ED Notes (Signed)
TTS cart at pt bedside for consult

## 2020-02-08 NOTE — ED Notes (Signed)
Pt made aware of the need for urine sample.  

## 2020-02-08 NOTE — Progress Notes (Signed)
Gillermo Murdoch, NP recommends continued observation for safety and stabilization and to be reassessed in the AM by psych. EDP Pricilla Loveless, MD and Bluford Kaufmann, RN have been advised.  Per AC, pt has a bed available at [facility] OBS unit pending negative covid, UDS, and fluids given.

## 2020-02-09 ENCOUNTER — Observation Stay (HOSPITAL_COMMUNITY): Admission: AD | Admit: 2020-02-09 | Payer: Medicaid Other | Source: Intra-hospital | Admitting: Psychiatry

## 2020-02-09 DIAGNOSIS — F4325 Adjustment disorder with mixed disturbance of emotions and conduct: Secondary | ICD-10-CM

## 2020-02-09 DIAGNOSIS — F332 Major depressive disorder, recurrent severe without psychotic features: Secondary | ICD-10-CM | POA: Diagnosis present

## 2020-02-09 LAB — RESPIRATORY PANEL BY RT PCR (FLU A&B, COVID)
Influenza A by PCR: NEGATIVE
Influenza B by PCR: NEGATIVE
SARS Coronavirus 2 by RT PCR: NEGATIVE

## 2020-02-09 MED ORDER — TRAZODONE HCL 100 MG PO TABS: 100.0000 mg | ORAL_TABLET | Freq: Every evening | ORAL | Status: DC | PRN

## 2020-02-09 MED ORDER — ALUM & MAG HYDROXIDE-SIMETH 200-200-20 MG/5ML PO SUSP: 30.0000 mL | ORAL | Status: DC | PRN

## 2020-02-09 MED ORDER — MAGNESIUM HYDROXIDE 400 MG/5ML PO SUSP: 30.0000 mL | Freq: Every day | ORAL | Status: DC | PRN

## 2020-02-09 MED ORDER — ACETAMINOPHEN 325 MG PO TABS: 650.0000 mg | ORAL_TABLET | Freq: Four times a day (QID) | ORAL | Status: DC | PRN

## 2020-02-09 NOTE — Progress Notes (Signed)
TOC CM will arrange appt with [facility] on Monday, 02/11/2020. Isidoro Donning RN CCM, [facility] ED TOC CM [phone]

## 2020-02-09 NOTE — ED Notes (Signed)
Patient given bus pass, discharge papers, and list of shelters [place].

## 2020-02-09 NOTE — ED Notes (Signed)
Pt transferred to room 25. Pt resting with eyes closed, even and unlabored respirations.

## 2020-02-09 NOTE — Consult Note (Addendum)
[facility] ED Discharge  02/09/2020 11:36 AM Hannah [name]  MRN:  [id number] Principal Problem: Adjustment disorder with mixed disturbance of emotions and conduct Discharge Diagnoses: Principal Problem:   Adjustment disorder with mixed disturbance of emotions and conduct Active Problems:   MDD (major depressive disorder), recurrent episode, severe ([place])   Subjective:Patient assessed by nurse practitioner along with Dr Darleene Cleaver.  Patient alert and oriented, answers appropriately.  Patient reports discharged from [facility] on yesterday.  Patient reports chronic suicidal ideations, states "I have been suicidal since age 29."  Patient denies plan or intent to harm self.  Patient denies homicidal ideations.  Patient denies access to weapons.  Patient denies auditory and visual hallucinations. Patient reports "I used to follow-up at [facility] in [place] but I have not been there in about a year because I cannot make it to my appointments because I am homeless and I have been homeless for 4 years."  Patient reports "I used weed, that is a medicine, I do not want to discontinue marijuana use because it is more effective than the medications you used."  Patient states "I do not know" when asked about home medications. Patient refuses treatment at this time.  Patient refuses to follow-up with outpatient psychiatry. Patient request bus pass to assist with transportation to a shelter.  Total Time spent with patient: 30 minutes  Past Psychiatric History:   Past Medical History:  Past Medical History:  Diagnosis Date  . ADHD (attention deficit hyperactivity disorder)   . Bipolar 1 disorder ([place])   . Depression   . Heart murmur   . Insomnia   . Obesity   . PCOS (polycystic ovarian syndrome)     Past Surgical History:  Procedure Laterality Date  . DILATION AND CURETTAGE OF UTERUS    . FRACTURE SURGERY Right    arm from compound fracture   Family History:  Family History  Family history  unknown: Yes   Family Psychiatric  History: Unknown Social History:  Social History   Substance and Sexual Activity  Alcohol Use Yes   Comment: occ     Social History   Substance and Sexual Activity  Drug Use No    Social History   Socioeconomic History  . Marital status: Single    Spouse name: Not on file  . Number of children: Not on file  . Years of education: Not on file  . Highest education level: Not on file  Occupational History  . Not on file  Tobacco Use  . Smoking status: Never Smoker  . Smokeless tobacco: Never Used  Substance and Sexual Activity  . Alcohol use: Yes    Comment: occ  . Drug use: No  . Sexual activity: Yes    Birth control/protection: None  Other Topics Concern  . Not on file  Social History Narrative  . Not on file   Social Determinants of Health   Financial Resource Strain:   . Difficulty of Paying Living Expenses: Not on file  Food Insecurity:   . Worried About Charity fundraiser in the Last Year: Not on file  . Ran Out of Food in the Last Year: Not on file  Transportation Needs:   . Lack of Transportation (Medical): Not on file  . Lack of Transportation (Non-Medical): Not on file  Physical Activity:   . Days of Exercise per Week: Not on file  . Minutes of Exercise per Session: Not on file  Stress:   . Feeling of  Stress : Not on file  Social Connections:   . Frequency of Communication with Friends and Family: Not on file  . Frequency of Social Gatherings with Friends and Family: Not on file  . Attends Religious Services: Not on file  . Active Member of Clubs or Organizations: Not on file  . Attends Banker Meetings: Not on file  . Marital Status: Not on file    Has this patient used any form of tobacco in the last 30 days? (Cigarettes, Smokeless Tobacco, Cigars, and/or Pipes) A prescription for an FDA-approved tobacco cessation medication was offered at discharge and the patient refused  Current  Medications: Current Facility-Administered Medications  Medication Dose Route Frequency Provider Last Rate Last Admin  . acetaminophen (TYLENOL) tablet 650 mg  650 mg Oral Q6H PRN Gillermo Murdoch, NP      . alum & mag hydroxide-simeth (MAALOX/MYLANTA) 200-200-20 MG/5ML suspension 30 mL  30 mL Oral Q4H PRN Gillermo Murdoch, NP      . magnesium hydroxide (MILK OF MAGNESIA) suspension 30 mL  30 mL Oral Daily PRN Gillermo Murdoch, NP      . nicotine (NICODERM CQ - dosed in mg/24 hours) patch 21 mg  21 mg Transdermal Daily Pricilla Loveless, MD      . ondansetron [facility]) tablet 4 mg  4 mg Oral Q8H PRN Pricilla Loveless, MD      . traZODone (DESYREL) tablet 100 mg  100 mg Oral QHS PRN Gillermo Murdoch, NP       Current Outpatient Medications  Medication Sig Dispense Refill  . mirtazapine (REMERON) 15 MG tablet Take 15 mg by mouth at bedtime.    . Oxcarbazepine (TRILEPTAL) 300 MG tablet Take 300 mg by mouth 2 (two) times daily.    . sertraline (ZOLOFT) 50 MG tablet Take 50 mg by mouth daily.    . cariprazine (VRAYLAR) capsule Take 1 capsule (1.5 mg total) by mouth daily. (Patient not taking: Reported on 01/27/2020) 30 capsule 0   PTA Medications: (Not in a hospital admission)   Musculoskeletal: Strength & Muscle Tone: within normal limits Gait & Station: normal Patient leans: N/A  Psychiatric Specialty Exam: Physical Exam Vitals and nursing note reviewed.  Constitutional:      Appearance: She is well-developed.  HENT:     Head: Normocephalic.  Cardiovascular:     Rate and Rhythm: Normal rate.  Pulmonary:     Effort: Pulmonary effort is normal.  Neurological:     Mental Status: She is alert and oriented to person, place, and time.  Psychiatric:        Attention and Perception: Attention normal.        Mood and Affect: Mood is depressed.        Speech: Speech normal.        Behavior: Behavior normal. Behavior is cooperative.        Thought Content: Thought content  includes suicidal ideation.        Cognition and Memory: Cognition normal.        Judgment: Judgment normal.     Review of Systems  Constitutional: Negative.   HENT: Negative.   Eyes: Negative.   Respiratory: Negative.   Cardiovascular: Negative.   Gastrointestinal: Negative.   Genitourinary: Negative.   Musculoskeletal: Negative.   Skin: Negative.   Neurological: Negative.   Psychiatric/Behavioral: Negative.     Blood pressure 122/62, pulse 84, temperature 98.8 F (37.1 C), temperature source Oral, resp. rate 18, height 5' 1.5" (1.562 m), weight 93.4  kg, last menstrual period 01/01/2020, SpO2 98 %.Body mass index is 38.29 kg/m.  General Appearance: Casual and Fairly Groomed  Eye Contact:  Good  Speech:  Clear and Coherent and Normal Rate  Volume:  Normal  Mood:  Depressed  Affect:  Appropriate and Congruent  Thought Process:  Coherent, Goal Directed and Descriptions of Associations: Intact  Orientation:  Full (Time, Place, and Person)  Thought Content:  WDL and Logical  Suicidal Thoughts:  No  Homicidal Thoughts:  No  Memory:  Immediate;   Good Recent;   Good Remote;   Good  Judgement:  Fair  Insight:  Fair  Psychomotor Activity:  Normal  Concentration:  Concentration: Good and Attention Span: Good  Recall:  Good  Fund of Knowledge:  Good  Language:  Good  Akathisia:  No  Handed:  Right  AIMS (if indicated):     Assets:  Communication Skills Desire for Improvement Financial Resources/Insurance Leisure Time Physical Health Resilience Social Support  ADL's:  Intact  Cognition:  WNL  Sleep:        Demographic Factors:  Caucasian  Loss Factors: NA  Historical Factors: NA  Risk Reduction Factors:   Positive social support, Positive therapeutic relationship and Positive coping skills or problem solving skills  Continued Clinical Symptoms:  Alcohol/Substance Abuse/Dependencies  Cognitive Features That Contribute To Risk:  None    Suicide Risk:   Minimal: No identifiable suicidal ideation.  Patients presenting with no risk factors but with morbid ruminations; may be classified as minimal risk based on the severity of the depressive symptoms    Plan Of Care/Follow-up recommendations:  Other:  Follow up with outpatient psychiatry, recommend [facility]  Disposition: Discharge with outpatient resources  Patrcia Dolly, FNP 02/09/2020, 11:36 AM  Patient seen face-to-face for psychiatric evaluation, chart reviewed and case discussed with the physician extender and developed treatment plan. Reviewed the information documented and agree with the treatment plan. Thedore Mins, MD

## 2020-02-09 NOTE — Progress Notes (Addendum)
12:49p CSW received call from [place] with [facility] who reports at this time there are no shelter beds. CSW also contacted shelters in surrounding area and they were also full or no answer received. CSW informed patient. CSW provided patient with homeless shelter resource list and a bus pass. Patient then stated she could contact someone to possibly stay in a camper. RN provided patient with her phone to call. No other social work needs noted. CSW signing off.   12:16p TOC consult received regarding patient needing assistance with homelessness. CSW spoke with patient who reports she has been living in a tent and the tent was recently [place] she [place]. CSW contacted [facility] for current shelter availability. CSW will continue to follow up.  Geralyn Corwin, LCSW Transitions of Care Department [facility] ED [phone]

## 2020-02-10 LAB — URINE CULTURE

## 2020-02-11 ENCOUNTER — Other Ambulatory Visit: Payer: Self-pay | Admitting: *Deleted

## 2021-04-15 IMAGING — CT CT RENAL STONE PROTOCOL
2 of 4 series · 16 of 46 positions shown, 18 images · non-contrast
Comparison: Prior CT from 10/29/2019.

CLINICAL DATA: Initial evaluation for acute flank pain, kidney
stone suspected.

EXAM:
CT ABDOMEN AND PELVIS WITHOUT CONTRAST
TECHNIQUE: Multidetector CT imaging of the abdomen and pelvis was performed
following the standard protocol without IV contrast.

[Series 2: axial st · axial · 0.68mm/px · z∈[-382,+18]mm · 13 of 90 slices shown, 15 images]
[im 5/90  soft-tissue]
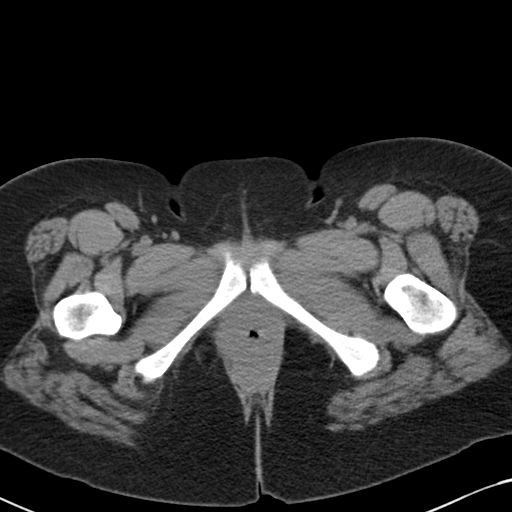
[im 5/90  bone]
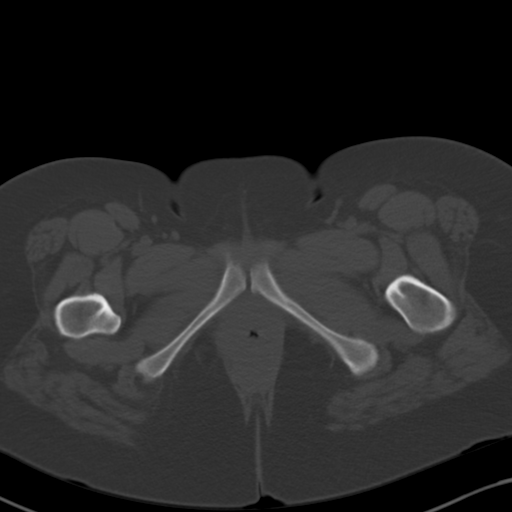
[im 10/90  soft-tissue]
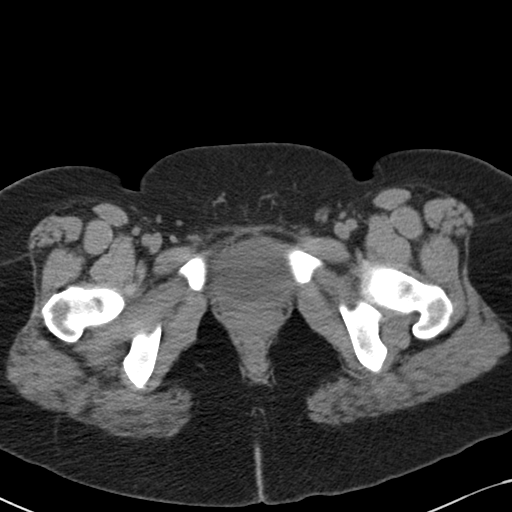
[im 20/90  soft-tissue]
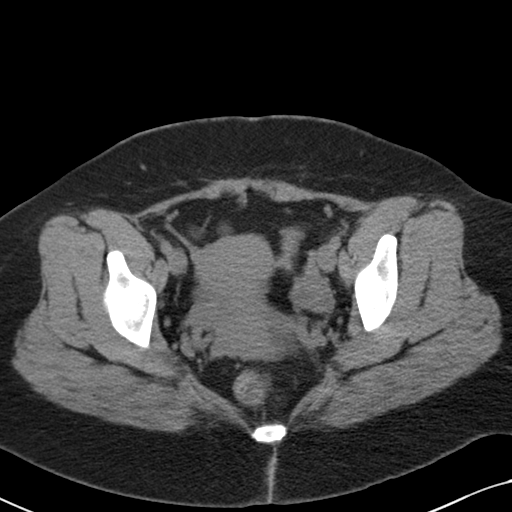
[im 25/90  soft-tissue]
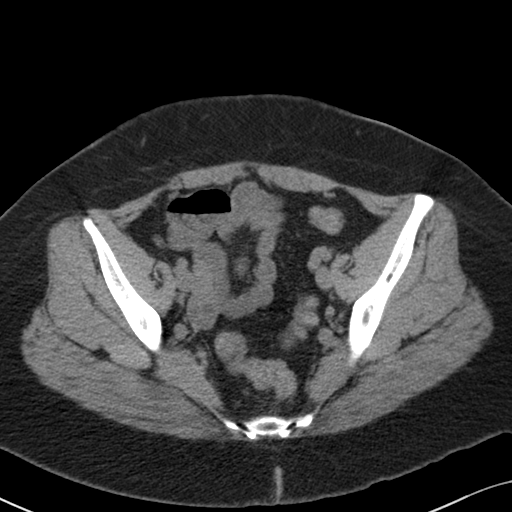
[im 30/90  soft-tissue]
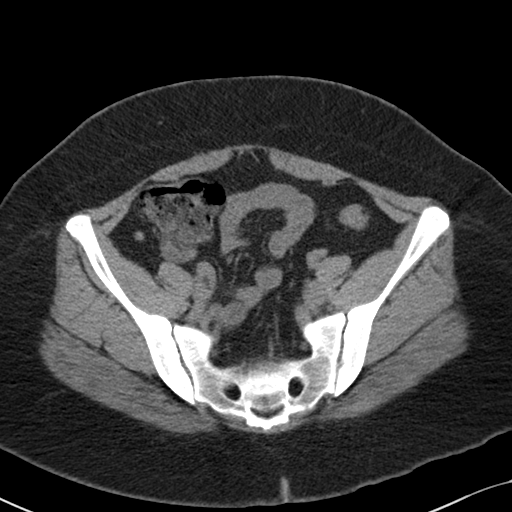
[im 40/90  soft-tissue]
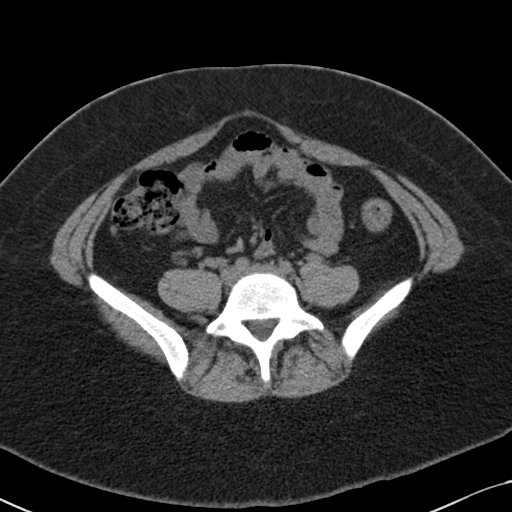
[im 45/90  soft-tissue]
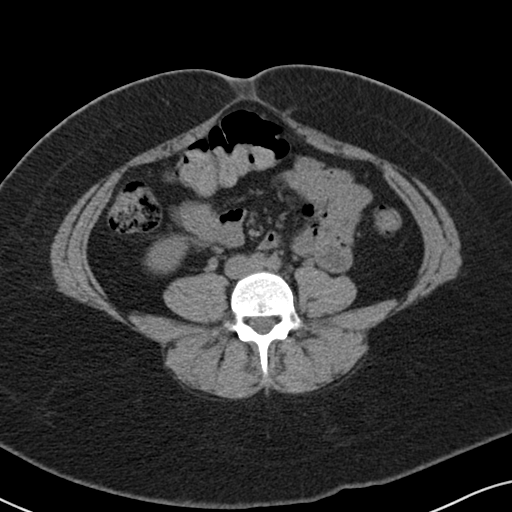
[im 50/90  soft-tissue]
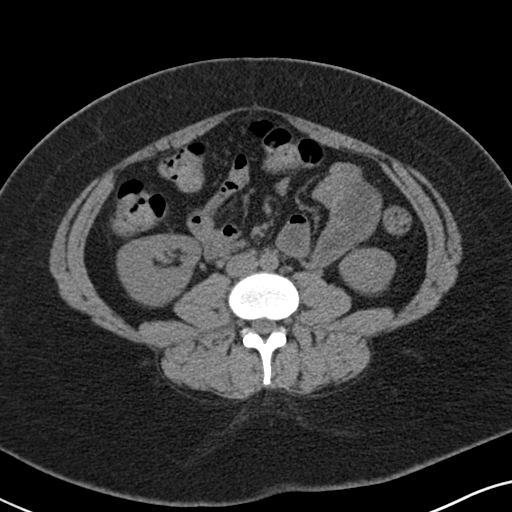
[im 60/90  soft-tissue]
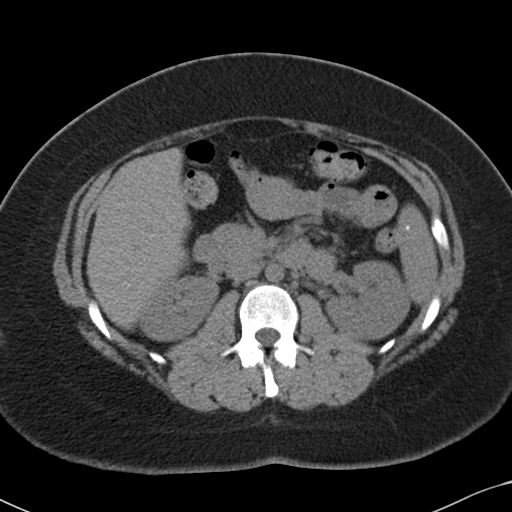
[im 60/90  bone]
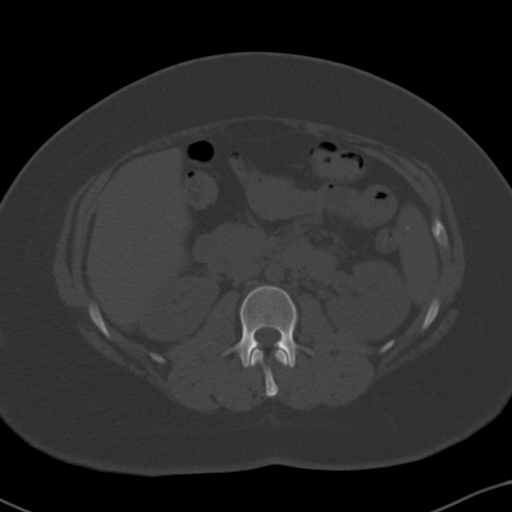
[im 65/90  soft-tissue]
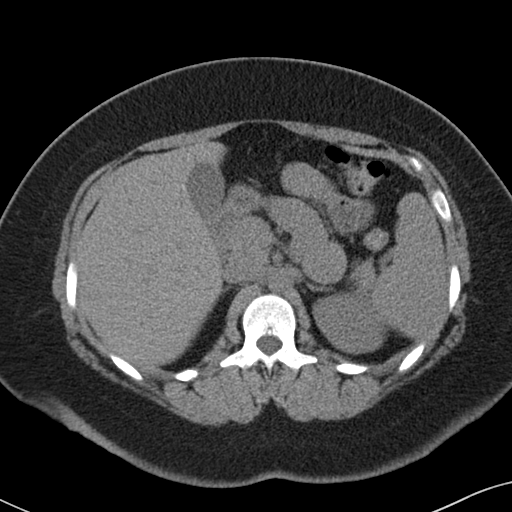
[im 70/90  soft-tissue]
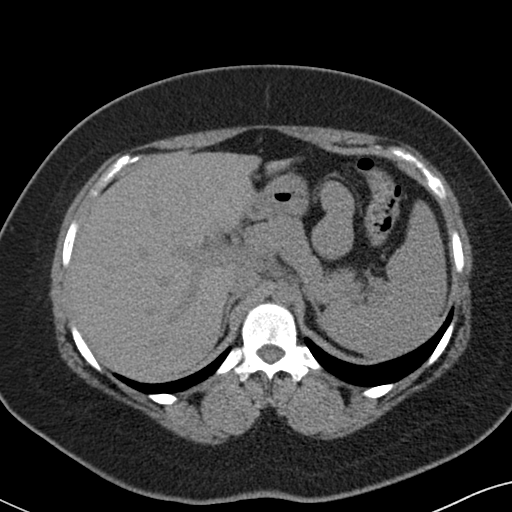
[im 80/90  soft-tissue]
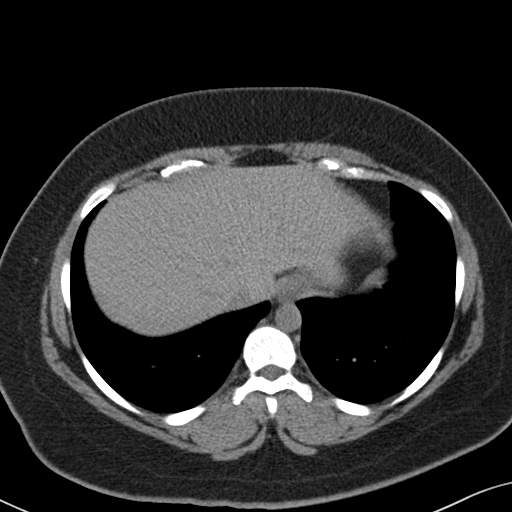
[im 85/90  soft-tissue]
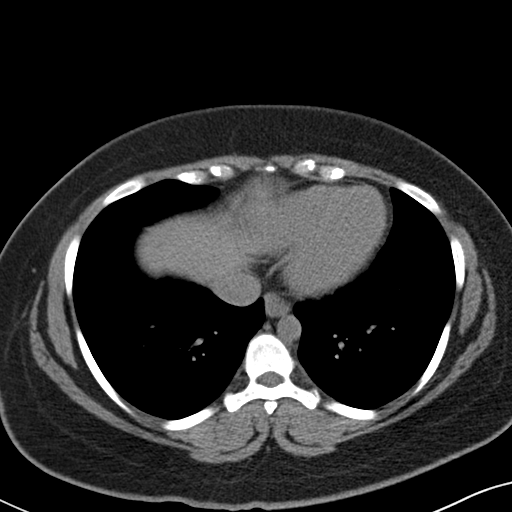

[Series 5: coronal · coronal · 0.64mm/px · 3 of 122 slices shown]
[im 41/122  soft-tissue]
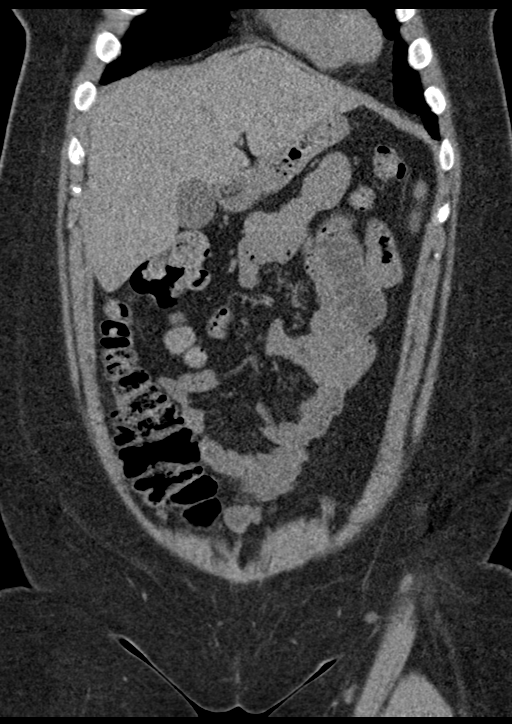
[im 54/122  soft-tissue]
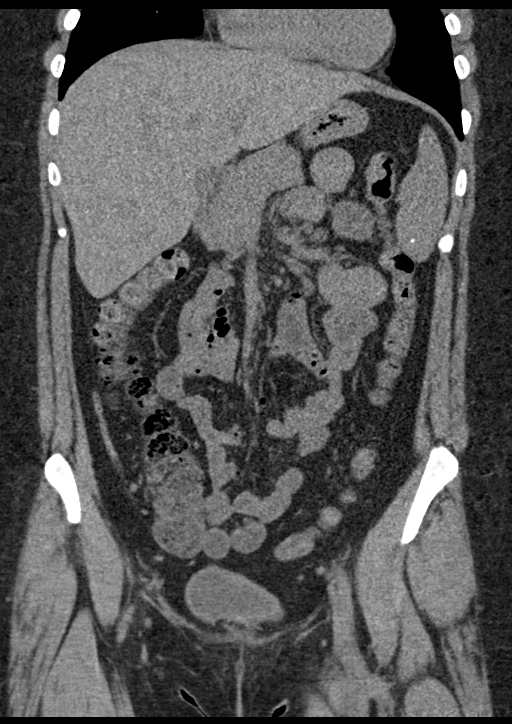
[im 68/122  soft-tissue]
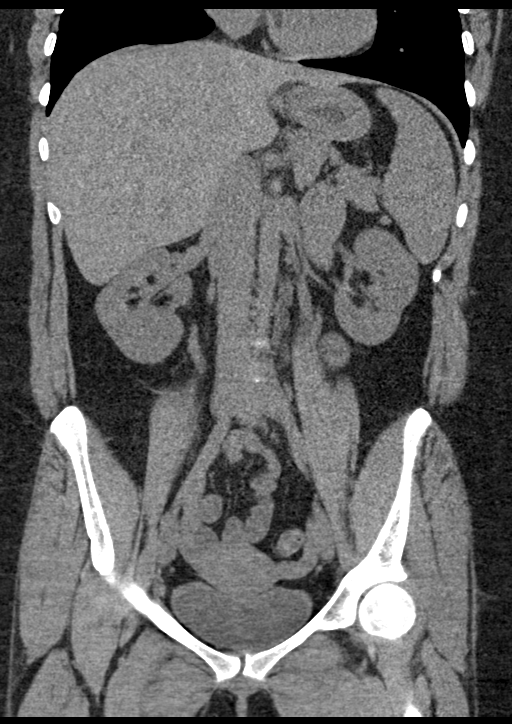

[16 of 46 positions shown; findings below may reference images not displayed]

FINDINGS: Lower chest: Visualized lung bases are clear.

Hepatobiliary: Limited noncontrast evaluation liver is unremarkable.
Gallbladder within normal limits. No biliary dilatation.

Pancreas: Pancreas within normal limits.

Spleen: Punctate calcified granuloma noted within the inferior
aspect of the spleen. Spleen otherwise unremarkable.

Adrenals/Urinary Tract: Adrenal glands within normal limits. Kidneys
equal size without nephrolithiasis or hydronephrosis. No radiopaque
calculi seen along the course of either renal collecting system. No
hydroureter or visible ureterolithiasis. Partially distended bladder
within normal limits. No layering stones within the bladder lumen.

Stomach/Bowel: Stomach within normal limits. No evidence for bowel
obstruction. Normal appendix. No acute inflammatory changes seen
about the bowels.

Vascular/Lymphatic: Intra-abdominal aorta of normal caliber. No
adenopathy.

Reproductive: Uterus within normal limits. Ovaries within normal
limits for age.

Other: No free air or fluid. Diastasis of the rectus abdominus
musculature with associated small fat containing paraumbilical
hernia. No associated inflammation.

Musculoskeletal: No acute osseous abnormality. No discrete or
worrisome osseous lesions.
IMPRESSION: 1. No CT evidence for acute intra-abdominal or pelvic process.
2. No CT evidence for nephrolithiasis or obstructive uropathy.
